# Patient Record
Sex: Male | Born: 1950
Health system: Southern US, Community
[De-identification: ages and names within clinical notes are randomized; demographics above are authoritative.]

## PROBLEM LIST (undated history)

## (undated) DIAGNOSIS — H269 Unspecified cataract: Secondary | ICD-10-CM

## (undated) DIAGNOSIS — Z8781 Personal history of (healed) traumatic fracture: Secondary | ICD-10-CM

## (undated) DIAGNOSIS — R001 Bradycardia, unspecified: Secondary | ICD-10-CM

## (undated) DIAGNOSIS — T7840XA Allergy, unspecified, initial encounter: Secondary | ICD-10-CM

## (undated) DIAGNOSIS — M25519 Pain in unspecified shoulder: Secondary | ICD-10-CM

## (undated) DIAGNOSIS — N529 Male erectile dysfunction, unspecified: Secondary | ICD-10-CM

## (undated) DIAGNOSIS — R011 Cardiac murmur, unspecified: Secondary | ICD-10-CM

## (undated) DIAGNOSIS — M7550 Bursitis of unspecified shoulder: Secondary | ICD-10-CM

## (undated) DIAGNOSIS — Z86018 Personal history of other benign neoplasm: Secondary | ICD-10-CM

## (undated) DIAGNOSIS — E119 Type 2 diabetes mellitus without complications: Secondary | ICD-10-CM

## (undated) HISTORY — PX: COLONOSCOPY: SHX174

## (undated) HISTORY — DX: Cardiac murmur, unspecified: R01.1

## (undated) HISTORY — DX: Pain in unspecified shoulder: M25.519

## (undated) HISTORY — DX: Bursitis of unspecified shoulder: M75.50

## (undated) HISTORY — DX: Type 2 diabetes mellitus without complications: E11.9

## (undated) HISTORY — DX: Bradycardia, unspecified: R00.1

## (undated) HISTORY — PX: EYE SURGERY: SHX253

## (undated) HISTORY — DX: Allergy, unspecified, initial encounter: T78.40XA

## (undated) HISTORY — DX: Male erectile dysfunction, unspecified: N52.9

## (undated) HISTORY — PX: OTHER SURGICAL HISTORY: SHX169

## (undated) HISTORY — DX: Unspecified cataract: H26.9

## (undated) HISTORY — DX: Personal history of (healed) traumatic fracture: Z87.81

---
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
## 1994-07-01 HISTORY — PX: RECONSTRUCTION OF NOSE: SHX2301

## 2006-09-24 ENCOUNTER — Ambulatory Visit: Payer: Self-pay | Admitting: Gastroenterology

## 2009-10-09 ENCOUNTER — Ambulatory Visit: Payer: Self-pay | Admitting: Family Medicine

## 2009-11-06 ENCOUNTER — Ambulatory Visit: Payer: Self-pay | Admitting: Family Medicine

## 2010-07-26 IMAGING — CR DG CHEST 2V
1 series · 2 of 2 positions shown · non-contrast
Comparison: none

REASON FOR EXAM: cough/fever
COMMENTS:

PROCEDURE:     KDR - KDXR CHEST PA (OR AP) AND LAT  - October 09, 2009 [DATE]
RESULT:
Areas of increased density project in the region of the right middle lobe
and superior segment left lower lobe. The cardiac silhouette and visualized
bony skeleton are unremarkable.

[Series 1: view not recorded · 0.17mm/px · 2 of 2 slices shown]
[im 1/2]
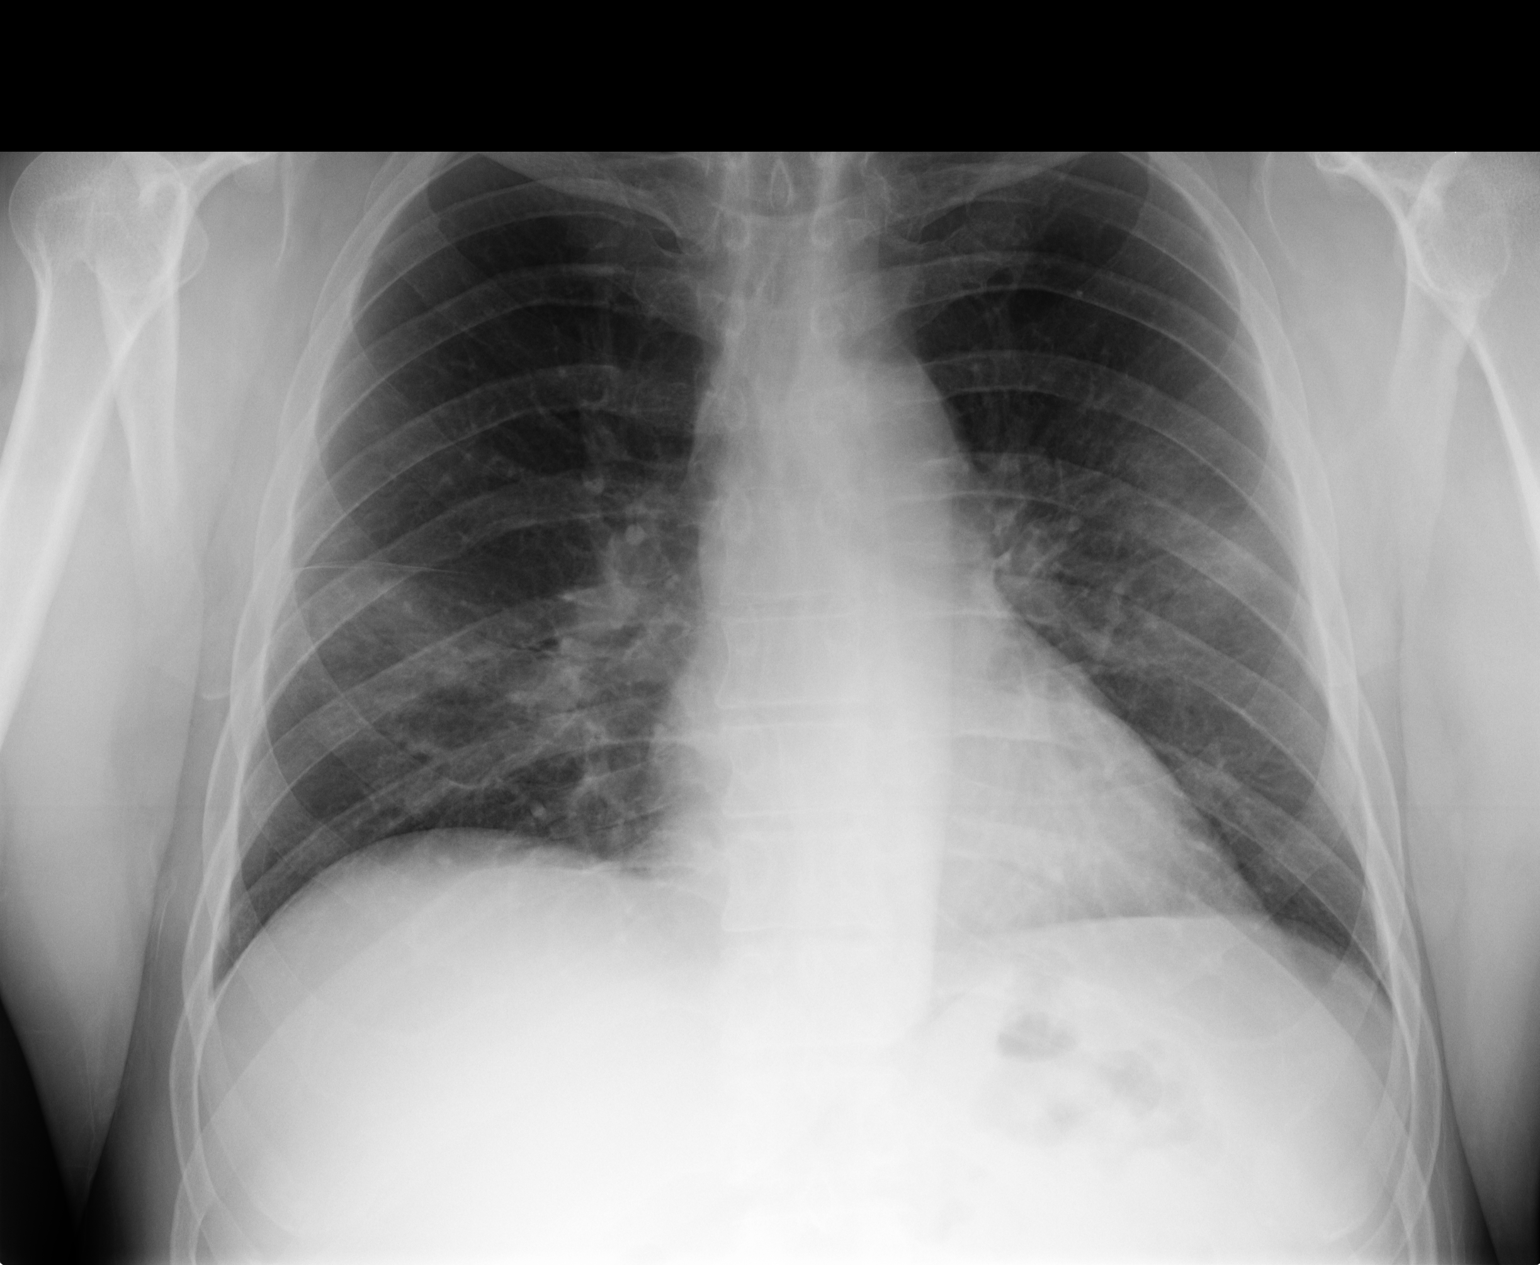
[im 2/2]
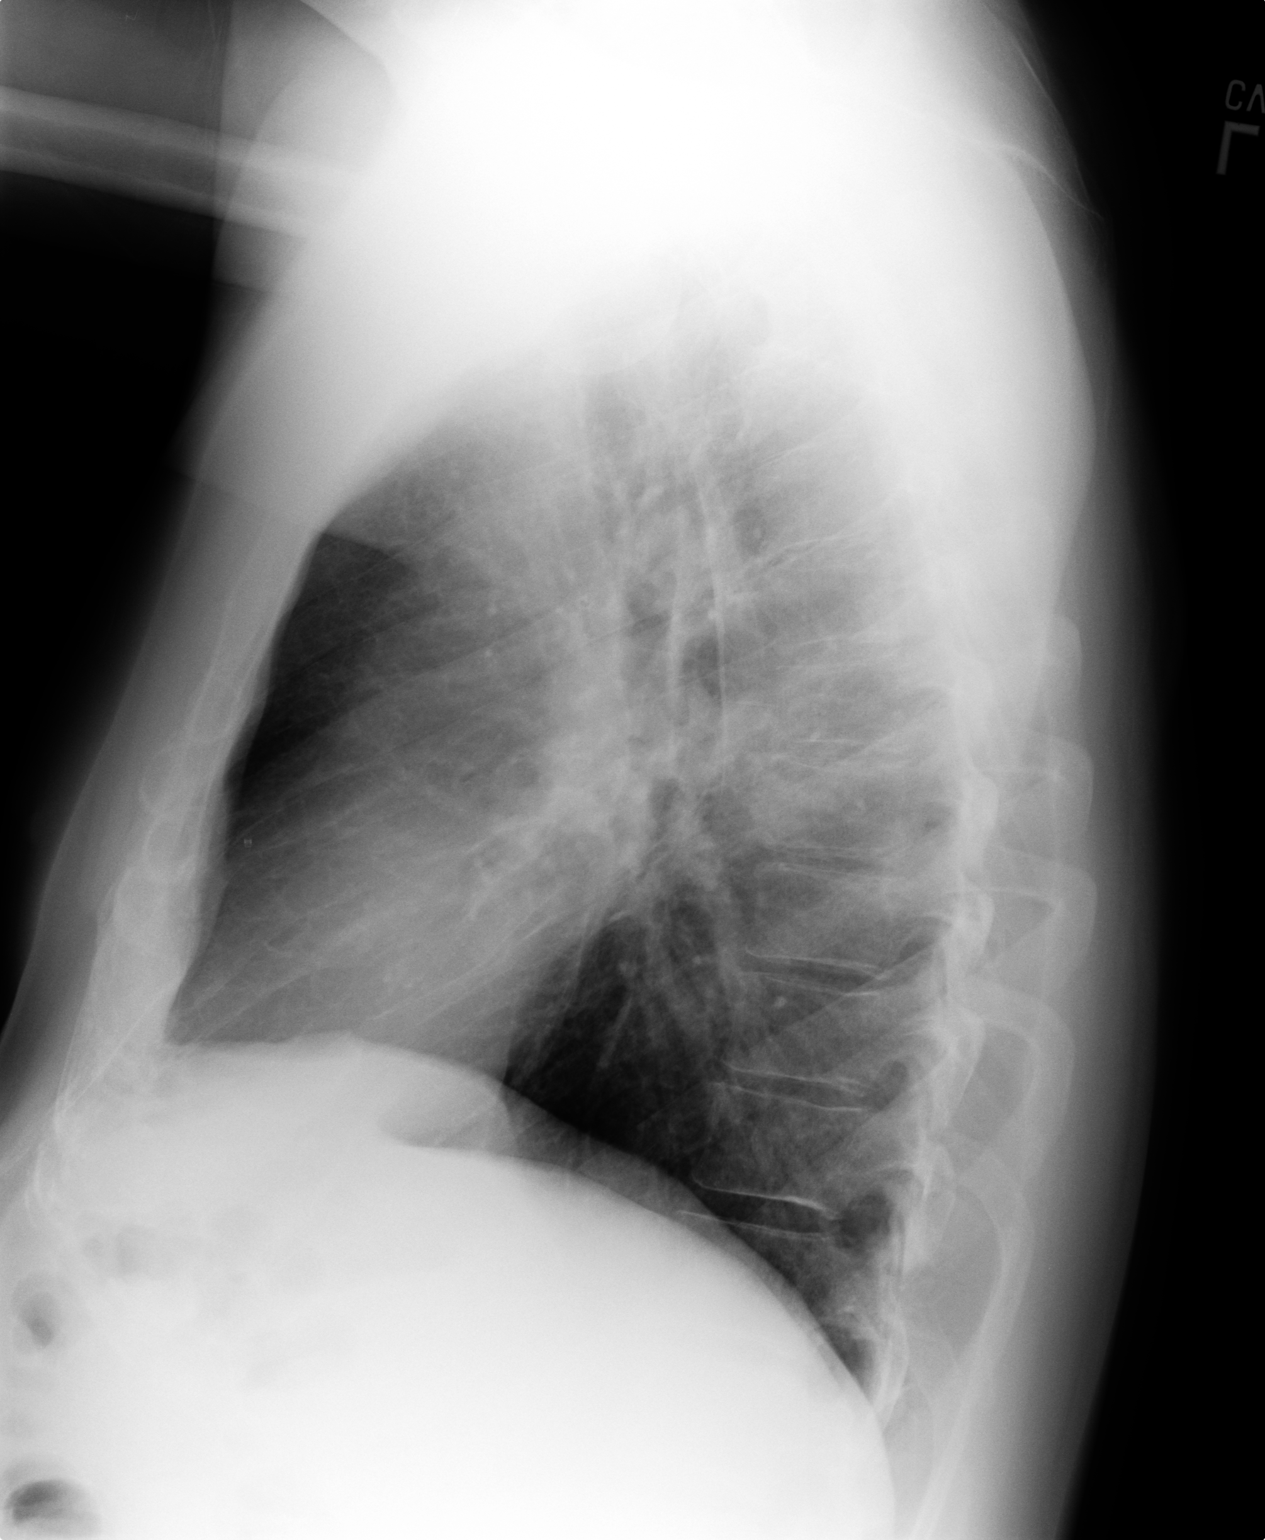

[2 of 2 positions shown; findings below may reference images not displayed]

IMPRESSION: 1. Multilobar infiltrates as described above. Surveillance evaluation
recommended status post appropriate therapeutic regimen.

## 2010-08-23 IMAGING — CR DG CHEST 2V
1 series · 2 of 2 positions shown · non-contrast
Comparison: none

REASON FOR EXAM: cough
COMMENTS:

[Series 1: view not recorded · 0.17mm/px · 2 of 2 slices shown]
[im 1/2]
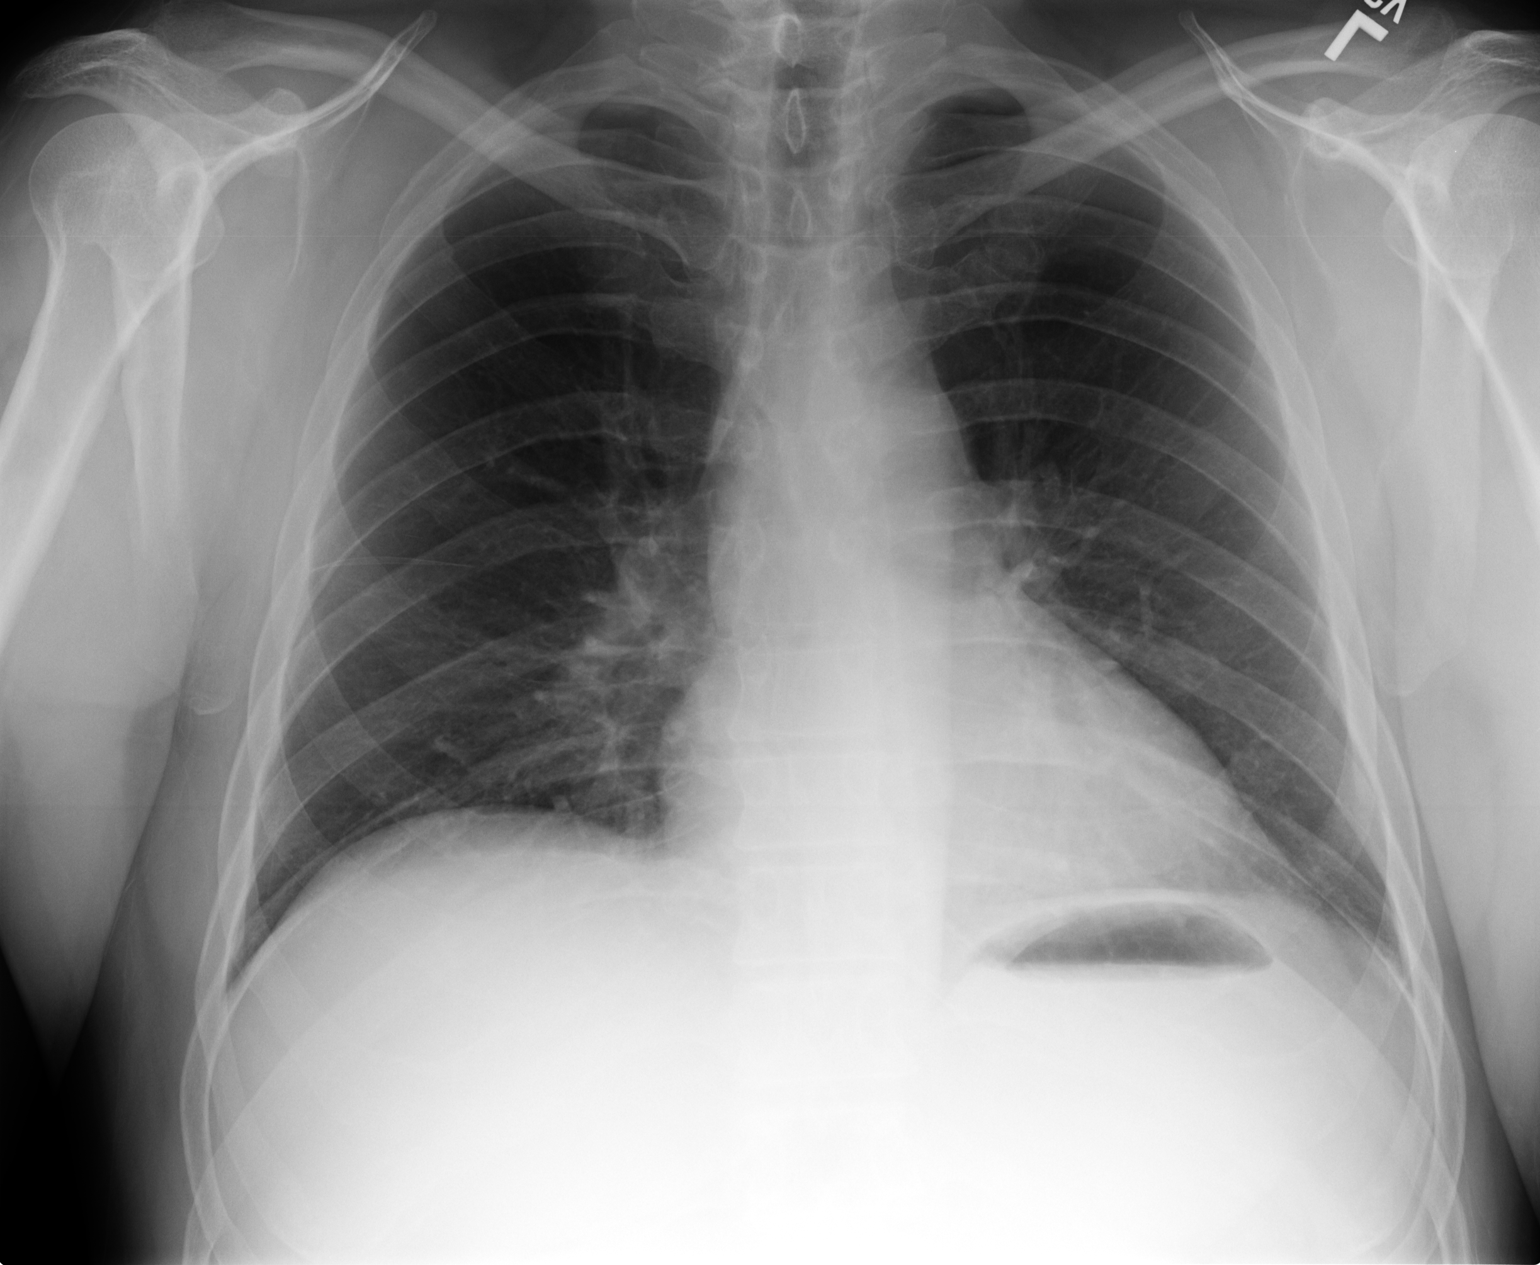
[im 2/2]
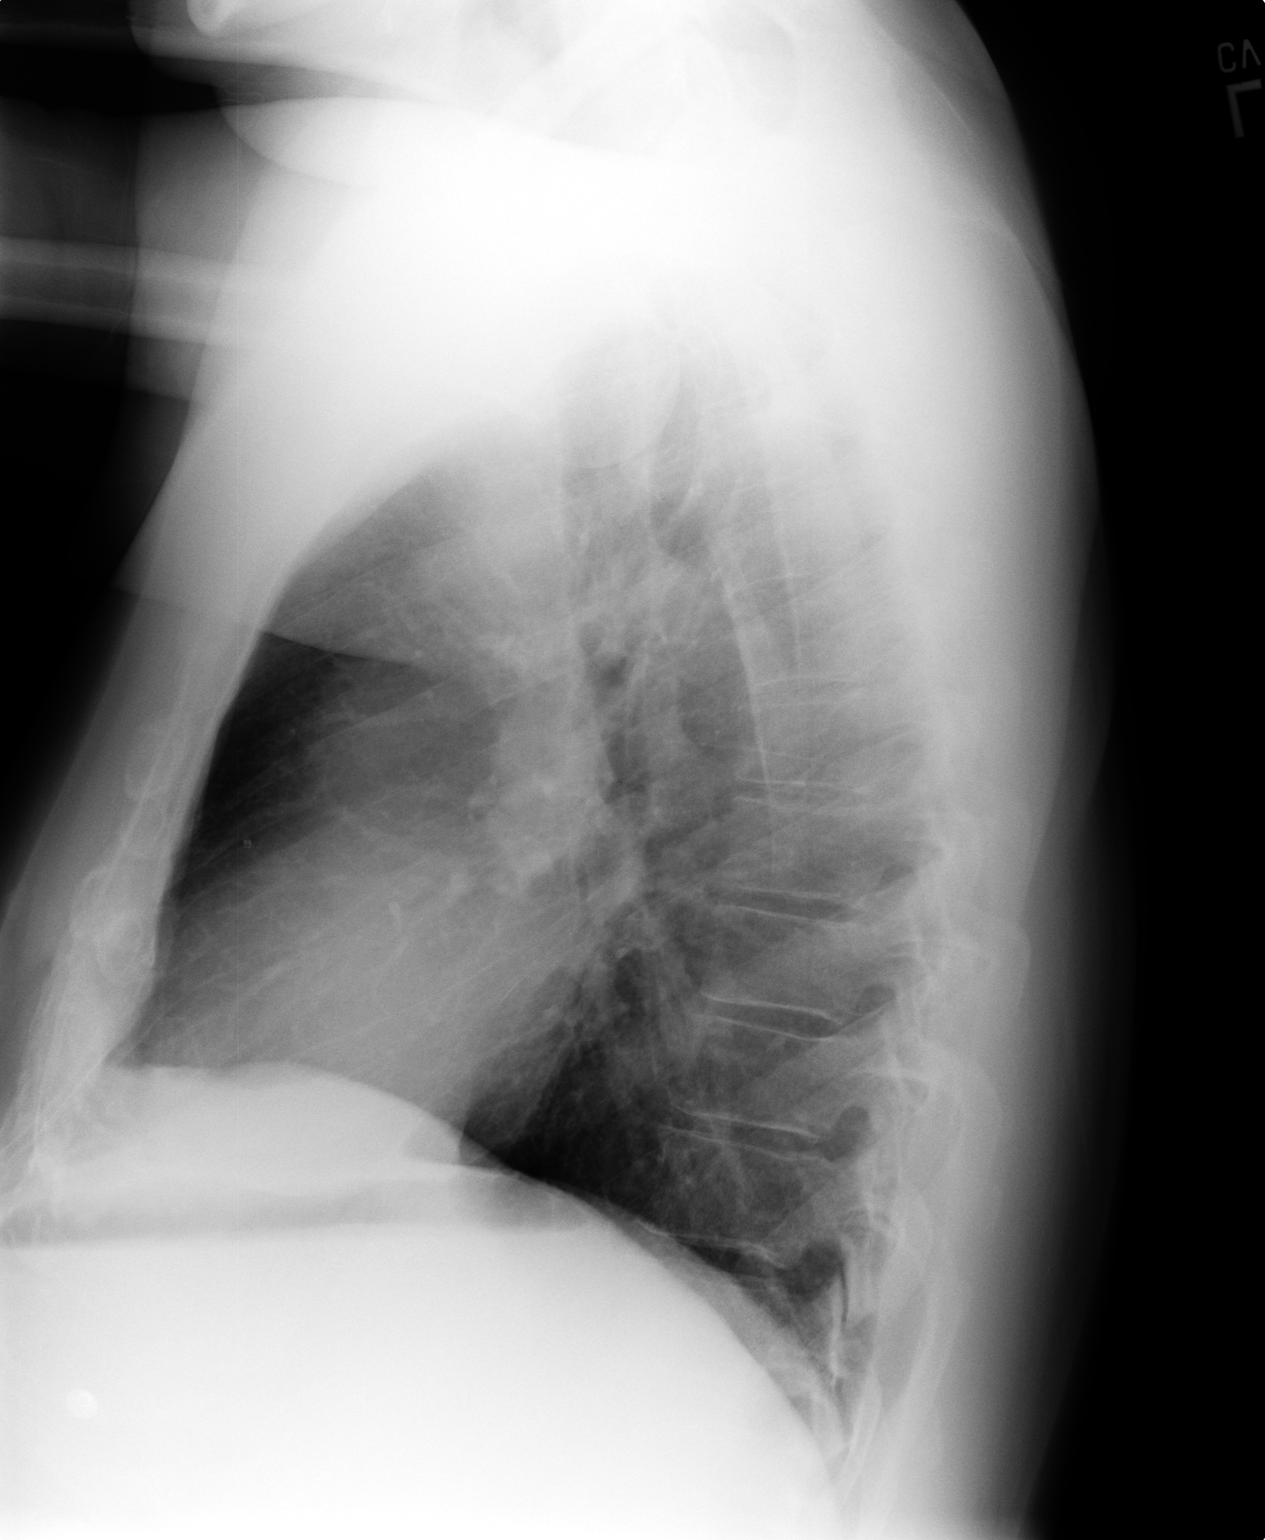

[2 of 2 positions shown; findings below may reference images not displayed]

PROCEDURE:     KDR - KDXR CHEST PA (OR AP) AND LAT  - November 06, 2009  [DATE]

RESULT:     Comparison is made to the prior exam of 10/09/2009. The
previously noted bilateral pulmonary infiltrates are no longer seen. The
current exam shows the lung fields to be clear. Heart size is normal. The
mediastinal and osseous structures are normal in appearance.
IMPRESSION: 1. No significant abnormalities are noted.
2. The previously observed bilateral pulmonary infiltrates have now cleared.

## 2012-06-12 LAB — HM HEPATITIS C SCREENING LAB: HM HEPATITIS C SCREENING: NEGATIVE

## 2012-07-13 DIAGNOSIS — Z86018 Personal history of other benign neoplasm: Secondary | ICD-10-CM

## 2012-07-13 HISTORY — DX: Personal history of other benign neoplasm: Z86.018

## 2013-04-09 ENCOUNTER — Encounter: Payer: Self-pay | Admitting: *Deleted

## 2013-04-09 ENCOUNTER — Ambulatory Visit (INDEPENDENT_AMBULATORY_CARE_PROVIDER_SITE_OTHER): Payer: BC Managed Care – PPO | Admitting: Cardiovascular Disease

## 2013-04-09 ENCOUNTER — Encounter: Payer: Self-pay | Admitting: Cardiovascular Disease

## 2013-04-09 VITALS — BP 143/82 | HR 68 | Ht 71.0 in | Wt 208.5 lb

## 2013-04-09 DIAGNOSIS — I491 Atrial premature depolarization: Secondary | ICD-10-CM

## 2013-04-09 DIAGNOSIS — R001 Bradycardia, unspecified: Secondary | ICD-10-CM

## 2013-04-09 DIAGNOSIS — I498 Other specified cardiac arrhythmias: Secondary | ICD-10-CM

## 2013-04-09 MED ORDER — ASPIRIN EC 81 MG PO TBEC: 81.0000 mg | DELAYED_RELEASE_TABLET | Freq: Every day | ORAL | Status: AC

## 2013-04-09 NOTE — Progress Notes (Signed)
HPI  This is a pleasant 62 year old man who was referred by Dr. Elease Hashimoto for evaluation of mild hypotension and bradycardia. He has no previous cardiac history. He has known history of type 2 diabetes which has been reasonably controlled. He has no history of hypertension or hyperlipidemia. There is no family history of coronary artery disease. He smoked many years ago he was a teenager. He denies any chest pain, dyspnea, dizziness, syncope or presyncope. Recently, he was noted to have a heart rate of 58 beats per minute. His labs were unremarkable including normal thyroid function. He denies any dizziness even when his heart rate is below 60. His blood pressure was also slightly low at 106/58. He did have a previous Holter monitor done which showed sinus rhythm with a minimum heart rate of 44 beats per minute an average heart rate of 66 beats per minute.  No Known Allergies   No current outpatient prescriptions on file prior to visit.   No current facility-administered medications on file prior to visit.     Past Medical History  Diagnosis Date  . Bursitis of shoulder   . Shoulder pain   . Diabetes mellitus without complication   . Bradycardia   . Erectile dysfunction   . History of broken nose   . Heart murmur     as child     Past Surgical History  Procedure Laterality Date  . Collar bone fracture    . Colonoscopy       Family History  Problem Relation Age of Onset  . Diabetes Mother   . Dementia Mother   . Hypertension Mother   . Bone cancer Father   . Diabetes Father   . Heart attack Paternal Grandfather      History   Social History  . Marital Status: Married    Spouse Name: N/A    Number of Children: N/A  . Years of Education: N/A   Occupational History  . Not on file.   Social History Main Topics  . Smoking status: Former Smoker -- 0.25 packs/day for 5 years    Types: Cigarettes  . Smokeless tobacco: Not on file  . Alcohol Use: Yes     Comment:  occassional  . Drug Use: No  . Sexual Activity: Not on file   Other Topics Concern  . Not on file   Social History Narrative  . No narrative on file     ROS A 10 point review of system was performed. It is negative other than what is mentioned in the history of present illness.  PHYSICAL EXAM   BP 143/82  Pulse 68  Ht 5\' 11"  (1.803 m)  Wt 208 lb 8 oz (94.575 kg)  BMI 29.09 kg/m2 Constitutional: He is oriented to person, place, and time. He appears well-developed and well-nourished. No distress.  HENT: No nasal discharge.  Head: Normocephalic and atraumatic.  Eyes: Pupils are equal and round. Right eye exhibits no discharge. Left eye exhibits no discharge.  Neck: Normal range of motion. Neck supple. No JVD present. No thyromegaly present.  Cardiovascular: Normal rate, regular rhythm with premature beats, normal heart sounds and. Exam reveals no gallop and no friction rub. No murmur heard.  Pulmonary/Chest: Effort normal and breath sounds normal. No stridor. No respiratory distress. He has no wheezes. He has no rales. He exhibits no tenderness.  Abdominal: Soft. Bowel sounds are normal. He exhibits no distension. There is no tenderness. There is no rebound and no guarding.  Musculoskeletal: Normal range of motion. He exhibits no edema and no tenderness.  Neurological: He is alert and oriented to person, place, and time. Coordination normal.  Skin: Skin is warm and dry. No rash noted. He is not diaphoretic. No erythema. No pallor.  Psychiatric: He has a normal mood and affect. His behavior is normal. Judgment and thought content normal.       EKG: Normal sinus rhythm with PACs and nonspecific ST changes.   ASSESSMENT AND PLAN

## 2013-04-09 NOTE — Assessment & Plan Note (Signed)
Mild sinus bradycardia but he is completely asymptomatic with no dizziness, syncope or presyncope. No evidence of AV block. Thyroid function was normal. No indication for intervention at this point. Should avoid AV nodal blocking agents.

## 2013-04-09 NOTE — Assessment & Plan Note (Signed)
He does have PACs noted on EKG but that he has no symptoms of palpitations. He has no symptoms suggestive of angina. Thus, no stress testing is indicated. Given his known history of diabetes, I do recommend aspirin 81 mg once daily. I discussed with him the importance of healthy diet and regular exercise. Recent lipid profile showed an LDL of less than 100.

## 2013-04-09 NOTE — Patient Instructions (Signed)
Start Aspirin 81 mg once daily.   Follow up as needed.

## 2013-04-21 ENCOUNTER — Encounter: Payer: Self-pay | Admitting: *Deleted

## 2014-10-25 LAB — HM DIABETES EYE EXAM

## 2014-12-27 DIAGNOSIS — E11319 Type 2 diabetes mellitus with unspecified diabetic retinopathy without macular edema: Secondary | ICD-10-CM | POA: Insufficient documentation

## 2014-12-27 DIAGNOSIS — R001 Bradycardia, unspecified: Secondary | ICD-10-CM | POA: Insufficient documentation

## 2014-12-27 DIAGNOSIS — N529 Male erectile dysfunction, unspecified: Secondary | ICD-10-CM | POA: Insufficient documentation

## 2014-12-27 DIAGNOSIS — M7550 Bursitis of unspecified shoulder: Secondary | ICD-10-CM | POA: Insufficient documentation

## 2015-01-13 ENCOUNTER — Ambulatory Visit (INDEPENDENT_AMBULATORY_CARE_PROVIDER_SITE_OTHER): Payer: BC Managed Care – PPO | Admitting: Family Medicine

## 2015-01-13 ENCOUNTER — Encounter: Payer: Self-pay | Admitting: Family Medicine

## 2015-01-13 VITALS — BP 106/60 | HR 56 | Temp 98.3°F | Resp 16 | Wt 208.0 lb

## 2015-01-13 DIAGNOSIS — E119 Type 2 diabetes mellitus without complications: Secondary | ICD-10-CM | POA: Diagnosis not present

## 2015-01-13 DIAGNOSIS — E785 Hyperlipidemia, unspecified: Secondary | ICD-10-CM

## 2015-01-13 LAB — POCT GLYCOSYLATED HEMOGLOBIN (HGB A1C): Hemoglobin A1C: 6.5

## 2015-01-13 NOTE — Progress Notes (Signed)
Subjective:    Patient ID: Mark Fitzpatrick, male    DOB: September 07, 1950, 64 y.o.   MRN: [id number]  Diabetes He presents for his follow-up diabetic visit. He has type 2 diabetes mellitus. His disease course has been stable (Last A1C was 09/19/2014 at 6.9%). Hypoglycemia symptoms include sweats. There are no diabetic associated symptoms. Symptoms are stable. Risk factors for coronary artery disease include dyslipidemia. Current diabetic treatment includes oral agent (triple therapy). He is compliant with treatment all of the time. His weight is stable.  Hyperlipidemia This is a chronic problem. The problem is controlled. Recent lipid tests were reviewed and are normal (Last Cholesterol 04/27/2014,  Total Cholesterol:  181;  Tri:  176;  HDL:  58;  LDL:  88). Exacerbating diseases include diabetes. There are no compliance problems.  Risk factors for coronary artery disease include dyslipidemia and diabetes mellitus.   Does not monitor why has lows. Has really not had trouble with it recently.  Will monitor.  Patient Active Problem List   Diagnosis Date Noted  . Bradycardia, sinus 12/27/2014  . Bursitis of shoulder 12/27/2014  . Diabetes mellitus, type 2 12/27/2014  . Failure of erection 12/27/2014  . HLD (hyperlipidemia) 12/27/2014  . Bradycardia 04/09/2013  . Premature atrial contractions 04/09/2013   Family History  Problem Relation Age of Onset  . Diabetes Mother   . Dementia Mother   . Hypertension Mother   . Bone cancer Father     spread to liver  . Diabetes Father   . Heart attack Paternal Grandfather   . Healthy Brother   . Ovarian cancer Maternal Grandmother   . Cancer Maternal Grandfather     unknown  . Healthy Brother    History   Social History  . Marital Status: Married    Spouse Name: N/A  . Number of Children: N/A  . Years of Education: H/S   Occupational History  . Retired    Social History Main Topics  . Smoking status: Former Smoker -- 0.25 packs/day for 5  years    Types: Cigarettes    Quit date: 06/30/1984  . Smokeless tobacco: Never Used  . Alcohol Use: Yes     Comment: occassional  . Drug Use: No  . Sexual Activity: Not on file   Other Topics Concern  . Not on file   Social History Narrative   Past Surgical History  Procedure Laterality Date  . Collar bone fracture    . Colonoscopy    . Reconstruction of nose  1996   No Known Allergies Previous Medications   ASPIRIN EC 81 MG TABLET    Take 1 tablet (81 mg total) by mouth daily.   CINNAMON PO    Take 1,000 mg by mouth 2 (two) times daily.   GLIMEPIRIDE (AMARYL) 4 MG TABLET    Take by mouth.   METFORMIN (GLUCOPHAGE) 1000 MG TABLET    Take by mouth.   PIOGLITAZONE (ACTOS) 45 MG TABLET    Take by mouth.   SILDENAFIL (VIAGRA) 50 MG TABLET    Take 50 mg by mouth daily as needed for erectile dysfunction.   SITAGLIPTIN (JANUVIA) 100 MG TABLET    Take by mouth.   BP 106/60 mmHg  Pulse 56  Temp(Src) 98.3 F (36.8 C) (Oral)  Resp 16  Wt 208 lb (94.348 kg)   Review of Systems  Constitutional: Negative.   Eyes: Negative.   Respiratory: Negative.   Cardiovascular: Negative.   Gastrointestinal: Negative.  Endocrine: Negative.   Neurological: Negative.       Objective:   Physical Exam  Constitutional: He is oriented to person, place, and time. He appears well-developed and well-nourished.  Cardiovascular: Normal rate and regular rhythm.   Pulmonary/Chest: Effort normal and breath sounds normal.  Neurological: He is alert and oriented to person, place, and time.  Psychiatric: He has a normal mood and affect. His behavior is normal. Judgment and thought content normal.  BP 106/60 mmHg  Pulse 56  Temp(Src) 98.3 F (36.8 C) (Oral)  Resp 16  Wt 208 lb (94.348 kg)     Assessment & Plan:  1. Type 2 diabetes mellitus without complication Stable.  Continue current medication and plan of care. Recheck in 3 months.  - POCT HgB A1C Results for orders placed or performed in  visit on 01/13/15  POCT HgB A1C  Result Value Ref Range   Hemoglobin A1C 6.5     2. HLD (hyperlipidemia) Will check cholesterol and inflammatory markers in the fall. 10 year risk of heart disease is 13 percent. Will readdress statin in the fall.   Margarita Rana, MD

## 2015-02-28 ENCOUNTER — Other Ambulatory Visit: Payer: Self-pay | Admitting: Family Medicine

## 2015-02-28 DIAGNOSIS — E119 Type 2 diabetes mellitus without complications: Secondary | ICD-10-CM

## 2015-04-21 ENCOUNTER — Ambulatory Visit (INDEPENDENT_AMBULATORY_CARE_PROVIDER_SITE_OTHER): Payer: Medicare Other | Admitting: Family Medicine

## 2015-04-21 ENCOUNTER — Encounter: Payer: Self-pay | Admitting: Family Medicine

## 2015-04-21 VITALS — BP 108/68 | HR 58 | Temp 98.2°F | Resp 16 | Ht 71.0 in | Wt 206.0 lb

## 2015-04-21 DIAGNOSIS — Z23 Encounter for immunization: Secondary | ICD-10-CM | POA: Diagnosis not present

## 2015-04-21 DIAGNOSIS — E119 Type 2 diabetes mellitus without complications: Secondary | ICD-10-CM | POA: Diagnosis not present

## 2015-04-21 DIAGNOSIS — E785 Hyperlipidemia, unspecified: Secondary | ICD-10-CM

## 2015-04-21 LAB — POCT GLYCOSYLATED HEMOGLOBIN (HGB A1C): Hemoglobin A1C: 6.4

## 2015-04-21 NOTE — Progress Notes (Signed)
Patient ID: Mark Fitzpatrick, male   DOB: Dec 16, 1950, 64 y.o.   MRN: [id number]        Patient: Mark Fitzpatrick Male    DOB: Nov 11, 1950   64 y.o.   MRN: [id number] Visit Date: 04/21/2015  Today's Provider: Margarita Rana, MD   Chief Complaint  Patient presents with  . Diabetes   Subjective:    HPI  Diabetes Mellitus Type II, Follow-up:   Lab Results  Component Value Date   HGBA1C 6.4 04/21/2015   HGBA1C 6.5 01/13/2015    Last seen for diabetes 3 months ago.  Management since then includes none. He reports excellent compliance with treatment. He is not having side effects.  Current symptoms include none and have been improving. Home blood sugar records: stable   Episodes of hypoglycemia? A few, did eat something and it got better.   Current Insulin Regimen: NONE Most Recent Eye Exam: Several months ago Weight trend: stable.  Current diet: Stable Current exercise: Walking and golfing.   Pertinent Labs: No results found for: CHOL, TRIG, CHOLHDL, CREATININE  Wt Readings from Last 3 Encounters:  04/21/15 206 lb (93.441 kg)  01/13/15 208 lb (94.348 kg)  09/19/14 208 lb (94.348 kg)    ------------------------------------------------------------------------    Lipid/Cholesterol, Follow-up:   Last seen for this 52months ago. Management changes since that visit include none. . Last Lipid Panel: No results found for: CHOL, TRIG, HDL, CHOLHDL, VLDL, LDLCALC, LDLDIRECT  Risk factors for vascular disease include diabetes.   Planning to recheck cholesterol today.  Has not been on any medication.   Wt Readings from Last 3 Encounters:  04/21/15 206 lb (93.441 kg)  01/13/15 208 lb (94.348 kg)  09/19/14 208 lb (94.348 kg)    -------------------------------------------------------------------      No Known Allergies Previous Medications   ASPIRIN EC 81 MG TABLET    Take 1 tablet (81 mg total) by mouth daily.   CINNAMON PO    Take 1,000 mg by mouth 2 (two)  times daily.   GLIMEPIRIDE (AMARYL) 4 MG TABLET    Take by mouth.   METFORMIN (GLUCOPHAGE) 1000 MG TABLET    TAKE 1 TABLET BY MOUTH TWICE A DAY   PIOGLITAZONE (ACTOS) 45 MG TABLET    Take by mouth.   SILDENAFIL (VIAGRA) 50 MG TABLET    Take 50 mg by mouth daily as needed for erectile dysfunction.   SITAGLIPTIN (JANUVIA) 100 MG TABLET    Take by mouth.    Review of Systems  Constitutional: Negative.   Eyes: Negative.   Respiratory: Negative.   Neurological: Negative.   Psychiatric/Behavioral: Negative.     Social History  Substance Use Topics  . Smoking status: Former Smoker -- 0.25 packs/day for 5 years    Types: Cigarettes    Quit date: 06/30/1984  . Smokeless tobacco: Never Used  . Alcohol Use: Yes     Comment: occassional   Objective:   BP 108/68 mmHg  Pulse 58  Temp(Src) 98.2 F (36.8 C) (Oral)  Resp 16  Ht 5\' 11"  (1.803 m)  Wt 206 lb (93.441 kg)  BMI 28.74 kg/m2  SpO2 98%  Physical Exam  Constitutional: He is oriented to person, place, and time. He appears well-developed and well-nourished.  Cardiovascular: Normal rate and regular rhythm.   Pulmonary/Chest: Effort normal and breath sounds normal.  Neurological: He is alert and oriented to person, place, and time.  Psychiatric: He has a normal mood and affect. His behavior is normal. Judgment  and thought content normal.      Assessment & Plan:     1. Type 2 diabetes mellitus without complication, unspecified long term insulin use status (HCC) Improved. Continue current medication and plan of care and recheck in 3 months.  - POCT glycosylated hemoglobin (Hb A1C) Results for orders placed or performed in visit on 04/21/15  POCT glycosylated hemoglobin (Hb A1C)  Result Value Ref Range   Hemoglobin A1C 6.4     2. Need for pneumococcal vaccination Given today. - Pneumococcal conjugate vaccine 13-valent IM  3. Need for influenza vaccination Given today.  - Flu vaccine HIGH DOSE PF  4. HLD  (hyperlipidemia) Unclear if needs medication. Reluctant to start it. Will check labs and reassess.   - Comprehensive metabolic panel - Lipid panel - CRP High sensitivity       Margarita Rana, MD  [facility]

## 2015-04-22 LAB — COMPREHENSIVE METABOLIC PANEL
A/G RATIO: 2.2 (ref 1.1–2.5)
ALK PHOS: 46 IU/L (ref 39–117)
ALT: 17 IU/L (ref 0–44)
AST: 15 IU/L (ref 0–40)
Albumin: 4.8 g/dL (ref 3.6–4.8)
BUN/Creatinine Ratio: 11 (ref 10–22)
BUN: 13 mg/dL (ref 8–27)
Bilirubin Total: 0.5 mg/dL (ref 0.0–1.2)
CO2: 25 mmol/L (ref 18–29)
Calcium: 10.1 mg/dL (ref 8.6–10.2)
Chloride: 102 mmol/L (ref 97–106)
Creatinine, Ser: 1.17 mg/dL (ref 0.76–1.27)
GFR calc Af Amer: 75 mL/min/{1.73_m2} (ref >59)
GFR calc non Af Amer: 65 mL/min/{1.73_m2} (ref >59)
Globulin, Total: 2.2 g/dL (ref 1.5–4.5)
Glucose: 172 mg/dL — ABNORMAL HIGH (ref 65–99)
POTASSIUM: 6.5 mmol/L — AB (ref 3.5–5.2)
SODIUM: 144 mmol/L (ref 136–144)
Total Protein: 7 g/dL (ref 6.0–8.5)

## 2015-04-22 LAB — LIPID PANEL
CHOL/HDL RATIO: 2.9 ratio (ref 0.0–5.0)
Cholesterol, Total: 185 mg/dL (ref 100–199)
HDL: 64 mg/dL (ref >39)
LDL CALC: 99 mg/dL (ref 0–99)
TRIGLYCERIDES: 109 mg/dL (ref 0–149)
VLDL CHOLESTEROL CAL: 22 mg/dL (ref 5–40)

## 2015-04-22 LAB — HIGH SENSITIVITY CRP: CRP HIGH SENSITIVITY: 0.37 mg/L (ref 0.00–3.00)

## 2015-04-24 ENCOUNTER — Telehealth: Payer: Self-pay

## 2015-04-24 NOTE — Telephone Encounter (Signed)
Advised pt of lab results. Pt verbally acknowledges understanding. Pt does not wish to start Statin at this time; agrees to recheck at 3 month FU. Renaldo Fiddler, CMA

## 2015-04-24 NOTE — Telephone Encounter (Signed)
-----   Message from Margarita Rana, MD sent at 04/22/2015  3:00 PM EDT ----- Cholesterol ok at 185.  Hs-CP is low.   Still not clear cut if should take medication,  10 year risk  Of heart disease is 14 percent and most sources would still recommend a statin.  Can also have cardiac calcium score done, which is like a cardiac CT and that would tell if there was any plaque build up. The test is 150 dollars. Insurance does not pay for it.  Or can recheck in 3 to 6 months and continue to eat healthy and exercise.   Thanks.

## 2015-04-25 ENCOUNTER — Telehealth: Payer: Self-pay

## 2015-04-25 NOTE — Telephone Encounter (Signed)
Advised wife of lab results. Renaldo Fiddler, CMA

## 2015-04-25 NOTE — Telephone Encounter (Signed)
Patient called and would like for someone to call and speak to his wife, Dustin Folks and tell her what he was told yesterday about his results, he did not understand information completely-aa

## 2015-07-21 ENCOUNTER — Ambulatory Visit (INDEPENDENT_AMBULATORY_CARE_PROVIDER_SITE_OTHER): Payer: Medicare Other | Admitting: Family Medicine

## 2015-07-21 ENCOUNTER — Encounter: Payer: Self-pay | Admitting: Family Medicine

## 2015-07-21 VITALS — BP 106/66 | HR 72 | Temp 98.0°F | Resp 16 | Ht 71.0 in | Wt 207.0 lb

## 2015-07-21 DIAGNOSIS — E785 Hyperlipidemia, unspecified: Secondary | ICD-10-CM | POA: Diagnosis not present

## 2015-07-21 DIAGNOSIS — E119 Type 2 diabetes mellitus without complications: Secondary | ICD-10-CM | POA: Diagnosis not present

## 2015-07-21 LAB — POCT GLYCOSYLATED HEMOGLOBIN (HGB A1C)
Est. average glucose Bld gHb Est-mCnc: 143
Hemoglobin A1C: 6.6

## 2015-07-21 NOTE — Progress Notes (Signed)
Patient ID: BREVAN Fitzpatrick, male   DOB: 1951-01-13, 65 y.o.   MRN: [id number]       Patient: Mark Fitzpatrick Male    DOB: 09-Feb-1951   65 y.o.   MRN: [id number] Visit Date: 07/21/2015  Today's Provider: Margarita Rana, MD   Chief Complaint  Patient presents with  . Diabetes  . Hyperlipidemia   Subjective:    HPI  Diabetes Mellitus Type II, Follow-up:   Lab Results  Component Value Date   HGBA1C 6.6 07/21/2015   HGBA1C 6.4 04/21/2015   HGBA1C 6.5 01/13/2015    Last seen for diabetes 3 months ago.  Management since then includes no changes. He reports excellent compliance with treatment. He is not having side effects.  Current symptoms include none and have been stable. Home blood sugar records: trend: stable 130's  Episodes of hypoglycemia? yes - 30, once last week   Most Recent Eye Exam: end of last year [facility] Weight trend: stable Prior visit with dietician: no Current diet: in general, a "healthy" diet   Current exercise: walking  Pertinent Labs:    Component Value Date/Time   CHOL 185 04/21/2015 1054   TRIG 109 04/21/2015 1054   HDL 64 04/21/2015 1054   LDLCALC 99 04/21/2015 1054   CREATININE 1.17 04/21/2015 1054    Wt Readings from Last 3 Encounters:  07/21/15 207 lb (93.895 kg)  04/21/15 206 lb (93.441 kg)  01/13/15 208 lb (94.348 kg)   ------------------------------------------------------------------------    Lipid/Cholesterol, Follow-up:   Last seen for this3 months ago.  Management changes since that visit include labs checked. . Last Lipid Panel:    Component Value Date/Time   CHOL 185 04/21/2015 1054   TRIG 109 04/21/2015 1054   HDL 64 04/21/2015 1054   CHOLHDL 2.9 04/21/2015 1054   Goodman 99 04/21/2015 1054    Risk factors for vascular disease include diabetes mellitus Patient aware that his heart disease is at 14% and was recommended to start statin. Patient declined and reported that he would work on healthy  lifestyle.  Current exercise: walking, 30-45 minutes 2-3 times a week.  Wt Readings from Last 3 Encounters:  07/21/15 207 lb (93.895 kg)  04/21/15 206 lb (93.441 kg)  01/13/15 208 lb (94.348 kg)   -------------------------------------------------------------------     No Known Allergies Previous Medications   ASPIRIN EC 81 MG TABLET    Take 1 tablet (81 mg total) by mouth daily.   CINNAMON PO    Take 1,000 mg by mouth 2 (two) times daily.   GLIMEPIRIDE (AMARYL) 4 MG TABLET    Take by mouth.   METFORMIN (GLUCOPHAGE) 1000 MG TABLET    TAKE 1 TABLET BY MOUTH TWICE A DAY   PIOGLITAZONE (ACTOS) 45 MG TABLET    Take by mouth.   SILDENAFIL (VIAGRA) 50 MG TABLET    Take 50 mg by mouth daily as needed for erectile dysfunction.   SITAGLIPTIN (JANUVIA) 100 MG TABLET    Take by mouth.    Review of Systems  Constitutional: Negative.   Eyes: Negative.   Cardiovascular: Negative.   Endocrine: Negative.     Social History  Substance Use Topics  . Smoking status: Former Smoker -- 0.25 packs/day for 5 years    Types: Cigarettes    Quit date: 06/30/1984  . Smokeless tobacco: Never Used  . Alcohol Use: Yes     Comment: occassional   Objective:   BP 106/66 mmHg  Pulse 72  Temp(Src) 98 F (  36.7 C) (Oral)  Resp 16  Ht 5\' 11"  (1.803 m)  Wt 207 lb (93.895 kg)  BMI 28.88 kg/m2  SpO2 96%  Physical Exam  Constitutional: He appears well-developed and well-nourished.  Cardiovascular: Normal rate, regular rhythm and normal heart sounds.   Pulmonary/Chest: Effort normal and breath sounds normal.  Skin: Skin is warm and dry.  Psychiatric: He has a normal mood and affect. His behavior is normal. Judgment and thought content normal.      Assessment & Plan:     1. Diabetes mellitus without complication (HCC) Stable. Patient advised to continue current medications and plan of care. Follow-up in 4 months. - POCT glycosylated hemoglobin (Hb A1C) Results for orders placed or performed in  visit on 07/21/15  POCT glycosylated hemoglobin (Hb A1C)  Result Value Ref Range   Hemoglobin A1C 6.6    Est. average glucose Bld gHb Est-mCnc 143    Diabetic Foot Exam - Simple   Simple Foot Form  Diabetic Foot exam was performed with the following findings:  Yes 07/21/2015 12:05 PM  Visual Inspection  No deformities, no ulcerations, no other skin breakdown bilaterally:  Yes  Sensation Testing  Intact to touch and monofilament testing bilaterally:  Yes  Pulse Check  Posterior Tibialis and Dorsalis pulse intact bilaterally:  Yes  Comments      2. HLD (hyperlipidemia) Stable. Patient advised to continue working on healthy lifestyle.      Patient seen and examined by Dr. Jerrell Belfast, and note scribed by Philbert Riser. Dimas, CMA.  I have reviewed the document for accuracy and completeness and I agree with above. - Jerrell Belfast, MD   Margarita Rana, MD  [facility]

## 2015-08-29 ENCOUNTER — Other Ambulatory Visit: Payer: Self-pay | Admitting: Family Medicine

## 2015-08-29 DIAGNOSIS — E119 Type 2 diabetes mellitus without complications: Secondary | ICD-10-CM

## 2015-09-05 ENCOUNTER — Other Ambulatory Visit: Payer: Self-pay

## 2015-09-05 DIAGNOSIS — E119 Type 2 diabetes mellitus without complications: Secondary | ICD-10-CM

## 2015-09-05 MED ORDER — PIOGLITAZONE HCL 45 MG PO TABS: 45.0000 mg | ORAL_TABLET | Freq: Every day | ORAL | Status: DC

## 2015-09-27 ENCOUNTER — Other Ambulatory Visit: Payer: Self-pay | Admitting: Family Medicine

## 2015-09-27 DIAGNOSIS — E119 Type 2 diabetes mellitus without complications: Secondary | ICD-10-CM

## 2015-11-14 DIAGNOSIS — E113299 Type 2 diabetes mellitus with mild nonproliferative diabetic retinopathy without macular edema, unspecified eye: Secondary | ICD-10-CM

## 2015-11-17 ENCOUNTER — Ambulatory Visit (INDEPENDENT_AMBULATORY_CARE_PROVIDER_SITE_OTHER): Payer: Medicare Other | Admitting: Family Medicine

## 2015-11-17 ENCOUNTER — Encounter: Payer: Self-pay | Admitting: Family Medicine

## 2015-11-17 VITALS — BP 90/52 | HR 62 | Temp 98.2°F | Resp 16 | Ht 71.0 in | Wt 208.0 lb

## 2015-11-17 DIAGNOSIS — E785 Hyperlipidemia, unspecified: Secondary | ICD-10-CM | POA: Diagnosis not present

## 2015-11-17 DIAGNOSIS — E119 Type 2 diabetes mellitus without complications: Secondary | ICD-10-CM | POA: Diagnosis not present

## 2015-11-17 DIAGNOSIS — R001 Bradycardia, unspecified: Secondary | ICD-10-CM

## 2015-11-17 LAB — POCT GLYCOSYLATED HEMOGLOBIN (HGB A1C)
Est. average glucose Bld gHb Est-mCnc: 154
Hemoglobin A1C: 7

## 2015-11-17 NOTE — Progress Notes (Signed)
Patient ID: Mark Fitzpatrick, male   DOB: 07-20-1950, 65 y.o.   MRN: [id number]       Patient: Mark Fitzpatrick Male    DOB: [birth year]   65 y.o.   MRN: [id number] Visit Date: 11/17/2015  Today's Provider: Margarita Rana, MD   Chief Complaint  Patient presents with  . Diabetes  . Hyperlipidemia   Subjective:    HPI  Diabetes Mellitus Type II, Follow-up:   Lab Results  Component Value Date   HGBA1C 7.0 11/17/2015   HGBA1C 6.6 07/21/2015   HGBA1C 6.4 04/21/2015    Last seen for diabetes 4 months ago.  Management since then includes checking A1c. He reports excellent compliance with treatment. He is not having side effects.  Current symptoms include none and have been stable. Home blood sugar records: fasting range: not being checked  Episodes of hypoglycemia? no   Current Insulin Regimen: Most Recent Eye Exam: up to date Weight trend: stable Prior visit with dietician: no Current diet: in general, a "healthy" diet   Current exercise: yard work  Pertinent Labs:    Component Value Date/Time   CHOL 185 04/21/2015 1054   TRIG 109 04/21/2015 1054   HDL 64 04/21/2015 1054   LDLCALC 99 04/21/2015 1054   CREATININE 1.17 04/21/2015 1054    Wt Readings from Last 3 Encounters:  11/17/15 208 lb (94.348 kg)  07/21/15 207 lb (93.895 kg)  04/21/15 206 lb (93.441 kg)    ------------------------------------------------------------------------   Lipid/Cholesterol, Follow-up:   Last seen for this4 months ago.  Management changes since that visit include checking labs. . Last Lipid Panel:    Component Value Date/Time   CHOL 185 04/21/2015 1054   TRIG 109 04/21/2015 1054   HDL 64 04/21/2015 1054   CHOLHDL 2.9 04/21/2015 1054   [place] 99 04/21/2015 1054    Risk factors for vascular disease include diabetes mellitus  He reports excellent compliance with treatment. He is not having side effects.  Current symptoms include none and have been stable. Weight trend:  stable Prior visit with dietician: no Current diet: in general, a "healthy" diet   Current exercise: yard work  [facility] Readings from Last 3 Encounters:  11/17/15 208 lb (94.348 kg)  07/21/15 207 lb (93.895 kg)  04/21/15 206 lb (93.441 kg)    -------------------------------------------------------------------     No Known Allergies Previous Medications   ASPIRIN EC 81 MG TABLET    Take 1 tablet (81 mg total) by mouth daily.   CINNAMON PO    Take 1,000 mg by mouth daily.    GLIMEPIRIDE (AMARYL) 4 MG TABLET    TAKE HALF OF A TABLET BY MOUTH TWICE A DAY   JANUVIA 100 MG TABLET    TAKE 1 TABLET BY MOUTH EVERY DAY   METFORMIN (GLUCOPHAGE) 1000 MG TABLET    TAKE 1 TABLET BY MOUTH TWICE A DAY   PIOGLITAZONE (ACTOS) 45 MG TABLET    Take 1 tablet (45 mg total) by mouth daily.   SILDENAFIL (VIAGRA) 50 MG TABLET    Take 50 mg by mouth daily as needed for erectile dysfunction.    Review of Systems  Constitutional: Negative.   HENT: Negative.   Cardiovascular: Negative.   Endocrine: Negative.     Social History  Substance Use Topics  . Smoking status: Former Smoker -- 0.25 packs/day for 5 years    Types: Cigarettes    Quit date: 06/30/1984  . Smokeless tobacco: Never Used  . Alcohol Use: Yes  Comment: ocassional   Objective:   BP 90/52 mmHg  Pulse 62  Temp(Src) 98.2 F (36.8 C) (Oral)  Resp 16  Ht 5\' 11"  (1.803 m)  Wt 208 lb (94.348 kg)  BMI 29.02 kg/m2  SpO2 97%  Physical Exam  Constitutional: He is oriented to person, place, and time. He appears well-developed and well-nourished.  Cardiovascular: Normal rate and regular rhythm.   Pulmonary/Chest: Effort normal and breath sounds normal.  Neurological: He is alert and oriented to person, place, and time.  Psychiatric: He has a normal mood and affect. His behavior is normal. Judgment and thought content normal.        Assessment & Plan:      1. Diabetes mellitus without complication ([place]) Stable,but not quite at  goal. Continue current medication and increase exercise.   Recheck in 3 months with Dr. Caryn Section.  - POCT glycosylated hemoglobin (Hb A1C) Results for orders placed or performed in visit on 11/17/15  POCT glycosylated hemoglobin (Hb A1C)  Result Value Ref Range   Hemoglobin A1C 7.0    Est. average glucose Bld gHb Est-mCnc 154    2. HLD (hyperlipidemia) Not at goal. Explained current guidelines of cholesterol medication for all diabetics. Patient reluctant to start medication.  Will recheck labs today.   - Lipid panel - Comprehensive Metabolic Panel (CMET)  3. Bradycardia, sinus BP is low today. Has not eaten. No symptoms.  Increase fluid intake, monitor, follow up with any symptoms, including light-headedness, fatigue, SOB, etc.    Patient seen and examined by Dr. Jerrell Belfast, and note scribed by Philbert Riser. Dimas, CMA.  I have reviewed the document for accuracy and completeness and I agree with above. - Jerrell Belfast, MD   Margarita Rana, MD  [facility]

## 2015-11-18 LAB — COMPREHENSIVE METABOLIC PANEL
ALK PHOS: 44 IU/L (ref 39–117)
ALT: 15 IU/L (ref 0–44)
AST: 14 IU/L (ref 0–40)
Albumin/Globulin Ratio: 1.9 (ref 1.2–2.2)
Albumin: 4.6 g/dL (ref 3.6–4.8)
BILIRUBIN TOTAL: 0.7 mg/dL (ref 0.0–1.2)
BUN / CREAT RATIO: 12 (ref 10–24)
BUN: 12 mg/dL (ref 8–27)
CO2: 24 mmol/L (ref 18–29)
CREATININE: 1 mg/dL (ref 0.76–1.27)
Calcium: 9.5 mg/dL (ref 8.6–10.2)
Chloride: 102 mmol/L (ref 96–106)
GFR calc Af Amer: 91 mL/min/{1.73_m2} (ref >59)
GFR calc non Af Amer: 79 mL/min/{1.73_m2} (ref >59)
GLUCOSE: 165 mg/dL — AB (ref 65–99)
Globulin, Total: 2.4 g/dL (ref 1.5–4.5)
POTASSIUM: 5.4 mmol/L — AB (ref 3.5–5.2)
SODIUM: 142 mmol/L (ref 134–144)
Total Protein: 7 g/dL (ref 6.0–8.5)

## 2015-11-18 LAB — LIPID PANEL
CHOL/HDL RATIO: 2.8 ratio (ref 0.0–5.0)
Cholesterol, Total: 174 mg/dL (ref 100–199)
HDL: 63 mg/dL (ref >39)
LDL CALC: 91 mg/dL (ref 0–99)
Triglycerides: 100 mg/dL (ref 0–149)
VLDL CHOLESTEROL CAL: 20 mg/dL (ref 5–40)

## 2015-11-20 ENCOUNTER — Telehealth: Payer: Self-pay

## 2015-11-20 NOTE — Telephone Encounter (Signed)
-----   Message from Margarita Rana, MD sent at 11/19/2015 11:34 AM EDT ----- Labs stable.  Cholesterol is 174 with high good cholesterol. 10 year risk of heart disease is 10.5 % and medication is still recommended. Please see if patient would like to start medication. Also, potassium is better, but is still slightly above normal. Please make sure patient stays well hydrated and avoids high potassium foods like dark green veggies, avocados, bananas and sweet potatoes.   Thanks.

## 2015-11-20 NOTE — Telephone Encounter (Signed)
Patient advised as below. Patient states that he does not want to start cholesterol medication at this time.

## 2015-12-05 ENCOUNTER — Encounter: Payer: Self-pay | Admitting: Family Medicine

## 2016-02-24 ENCOUNTER — Other Ambulatory Visit: Payer: Self-pay | Admitting: Family Medicine

## 2016-02-24 DIAGNOSIS — E119 Type 2 diabetes mellitus without complications: Secondary | ICD-10-CM

## 2016-02-27 ENCOUNTER — Other Ambulatory Visit: Payer: Self-pay | Admitting: Family Medicine

## 2016-02-27 DIAGNOSIS — E119 Type 2 diabetes mellitus without complications: Secondary | ICD-10-CM

## 2016-03-20 ENCOUNTER — Encounter: Payer: Self-pay | Admitting: Family Medicine

## 2016-03-20 ENCOUNTER — Ambulatory Visit (INDEPENDENT_AMBULATORY_CARE_PROVIDER_SITE_OTHER): Payer: Medicare Other | Admitting: Family Medicine

## 2016-03-20 VITALS — BP 110/60 | HR 70 | Temp 98.2°F | Resp 16 | Ht 71.0 in | Wt 211.0 lb

## 2016-03-20 DIAGNOSIS — E119 Type 2 diabetes mellitus without complications: Secondary | ICD-10-CM | POA: Diagnosis not present

## 2016-03-20 DIAGNOSIS — Z23 Encounter for immunization: Secondary | ICD-10-CM

## 2016-03-20 LAB — POCT UA - MICROALBUMIN: Microalbumin Ur, POC: NEGATIVE mg/L

## 2016-03-20 LAB — POCT GLYCOSYLATED HEMOGLOBIN (HGB A1C)
Est. average glucose Bld gHb Est-mCnc: 146
HEMOGLOBIN A1C: 6.7

## 2016-03-20 MED ORDER — PIOGLITAZONE HCL 45 MG PO TABS: 45.0000 mg | ORAL_TABLET | Freq: Every day | ORAL | 3 refills | Status: DC

## 2016-03-20 NOTE — Progress Notes (Signed)
Patient: Mark Fitzpatrick Male    DOB: 11-Sep-1950   65 y.o.   MRN: [id number] Visit Date: 03/20/2016  Today's Provider: Lelon Huh, MD   Chief Complaint  Patient presents with  . Follow-up  . Diabetes  . Hyperlipidemia   Subjective:    HPI    Diabetes Mellitus Type II, Follow-up:   Lab Results  Component Value Date   HGBA1C 7.0 11/17/2015   HGBA1C 6.6 07/21/2015   HGBA1C 6.4 04/21/2015   Last seen for diabetes 4 months ago.  Management since then includes; no changes. He reports good compliance with treatment. He is not having side effects. none Current symptoms include none and have been unchanged. Home blood sugar records: fasting range: 80-137  Episodes of hypoglycemia? no   Current Insulin Regimen: n/a Most Recent Eye Exam: due Weight trend: stable Prior visit with dietician: no Current diet: in general, a "healthy" diet   Current exercise: some  ----------------------------------------------------------------    Lipid/Cholesterol, Follow-up:   Last seen for this 4 months ago.  Management since that visit includes; recommended starting medication but patient declined.  Last Lipid Panel:    Component Value Date/Time   CHOL 174 11/17/2015 1153   TRIG 100 11/17/2015 1153   HDL 63 11/17/2015 1153   CHOLHDL 2.8 11/17/2015 1153   LDLCALC 91 11/17/2015 1153    He reports good compliance with treatment. He is not having side effects. none  Wt Readings from Last 3 Encounters:  03/20/16 211 lb (95.7 kg)  11/17/15 208 lb (94.3 kg)  07/21/15 207 lb (93.9 kg)    ----------------------------------------------------------------    No Known Allergies   Current Outpatient Prescriptions:  .  aspirin EC 81 MG tablet, Take 1 tablet (81 mg total) by mouth daily., Disp: 90 tablet, Rfl: 3 .  CINNAMON PO, Take 1,000 mg by mouth daily. , Disp: , Rfl:  .  glimepiride (AMARYL) 4 MG tablet, TAKE HALF OF A TABLET BY MOUTH TWICE A DAY (Patient taking  differently: TAKE HALF OF A TABLET BY MOUTH daily), Disp: 90 tablet, Rfl: 1 .  JANUVIA 100 MG tablet, TAKE 1 TABLET BY MOUTH EVERY DAY, Disp: 90 tablet, Rfl: 3 .  metFORMIN (GLUCOPHAGE) 1000 MG tablet, TAKE 1 TABLET BY MOUTH TWICE A DAY, Disp: 180 tablet, Rfl: 3 .  pioglitazone (ACTOS) 45 MG tablet, Take 1 tablet (45 mg total) by mouth daily., Disp: 90 tablet, Rfl: 1 .  sildenafil (VIAGRA) 50 MG tablet, Take 50 mg by mouth daily as needed for erectile dysfunction., Disp: , Rfl:   Review of Systems  Constitutional: Negative for appetite change, chills and fever.  Respiratory: Negative for chest tightness, shortness of breath and wheezing.   Cardiovascular: Negative for chest pain and palpitations.  Gastrointestinal: Negative for abdominal pain, nausea and vomiting.    Social History  Substance Use Topics  . Smoking status: Former Smoker    Packs/day: 0.25    Years: 5.00    Types: Cigarettes    Quit date: 06/30/1984  . Smokeless tobacco: Never Used  . Alcohol use Yes     Comment: ocassional   Objective:   BP 110/60 (BP Location: Right Arm, Patient Position: Sitting, Cuff Size: Large)   Pulse 70   Temp 98.2 F (36.8 C) (Oral)   Resp 16   Ht 5\' 11"  (1.803 m)   Wt 211 lb (95.7 kg)   BMI 29.43 kg/m     Depression screen [facility] 2/9 03/20/2016  Decreased Interest 0  Down, Depressed, Hopeless 0  PHQ - 2 Score 0  Altered sleeping 0  Tired, decreased energy 0  Change in appetite 0  Feeling bad or failure about yourself  0  Trouble concentrating 0  Moving slowly or fidgety/restless 0  Suicidal thoughts 0  PHQ-9 Score 0  Difficult doing work/chores Not difficult at all   Fall Risk  03/20/2016  Falls in the past year? No   Functional Status Survey: Is the patient deaf or have difficulty hearing?: Yes (slight hearing loss) Does the patient have difficulty seeing, even when wearing glasses/contacts?: No Does the patient have difficulty concentrating, remembering, or making  decisions?: No Does the patient have difficulty walking or climbing stairs?: No Does the patient have difficulty dressing or bathing?: No Does the patient have difficulty doing errands alone such as visiting a doctor's office or shopping?: No    Physical Exam   General Appearance:    Alert, cooperative, no distress  Eyes:    PERRL, conjunctiva/corneas clear, EOM's intact       Lungs:     Clear to auscultation bilaterally, respirations unlabored  Heart:    Regular rate and rhythm  Neurologic:   Awake, alert, oriented x 3. No apparent focal neurological           defect.       Results for orders placed or performed in visit on 03/20/16  POCT glycosylated hemoglobin (Hb A1C)  Result Value Ref Range   Hemoglobin A1C 6.7    Est. average glucose Bld gHb Est-mCnc 146        Assessment & Plan:     1. Type 2 diabetes mellitus without complication, without long-term current use of insulin (HCC) Well controlled.  Continue current medications.   - POCT glycosylated hemoglobin (Hb A1C) - pioglitazone (ACTOS) 45 MG tablet; Take 1 tablet (45 mg total) by mouth daily. Please place prescription on hold until patient calls for refill  Dispense: 90 tablet; Refill: 3 - POCT UA - Microalbumin  Discussed Recommendations for statin therapy in diabetics including risk of side effects and cardiac benefits. Considering diabetes and blood pressure are well controlled and normal cholesterol readings he decided to defer statin therapy for the time being. He will continue taking daily ECASA.   3. Need for influenza vaccination  - Flu vaccine HIGH DOSE PF  Return in about 6 months (around 09/17/2016).       Lelon Huh, MD  [facility]

## 2016-06-29 ENCOUNTER — Other Ambulatory Visit: Payer: Self-pay | Admitting: Family Medicine

## 2016-06-29 DIAGNOSIS — E119 Type 2 diabetes mellitus without complications: Secondary | ICD-10-CM

## 2016-09-18 ENCOUNTER — Encounter: Payer: Self-pay | Admitting: Family Medicine

## 2016-09-18 ENCOUNTER — Ambulatory Visit (INDEPENDENT_AMBULATORY_CARE_PROVIDER_SITE_OTHER): Payer: Medicare Other | Admitting: Family Medicine

## 2016-09-18 VITALS — BP 118/62 | HR 73 | Temp 98.0°F | Resp 16 | Wt 213.0 lb

## 2016-09-18 DIAGNOSIS — E118 Type 2 diabetes mellitus with unspecified complications: Secondary | ICD-10-CM

## 2016-09-18 LAB — POCT GLYCOSYLATED HEMOGLOBIN (HGB A1C)
ESTIMATED AVERAGE GLUCOSE: 154
Hemoglobin A1C: 7

## 2016-09-18 NOTE — Progress Notes (Signed)
Patient: Mark Fitzpatrick Male    DOB: 02-22-1951   66 y.o.   MRN: [id number] Visit Date: 09/18/2016  Today's Provider: Lelon Huh, MD   Chief Complaint  Patient presents with  . Diabetes    follow up   Subjective:    HPI  Diabetes Mellitus Type II, Follow-up:   Lab Results  Component Value Date   HGBA1C 6.7 03/20/2016   HGBA1C 7.0 11/17/2015   HGBA1C 6.6 07/21/2015    Last seen for diabetes 6 months ago.  Management since then includes no changes. He reports good compliance with treatment. He is not having side effects.  Current symptoms include none and have been stable. Home blood sugar records: fasting range: 70-150  Episodes of hypoglycemia? yes - occasionally when he doesn't eat on time   Current Insulin Regimen: none Most Recent Eye Exam: 1 year ago Weight trend: stable Prior visit with dietician: no Current diet: in general, a "healthy" diet   Current exercise: none  Pertinent Labs:    Component Value Date/Time   CHOL 174 11/17/2015 1153   TRIG 100 11/17/2015 1153   HDL 63 11/17/2015 1153   LDLCALC 91 11/17/2015 1153   CREATININE 1.00 11/17/2015 1153    Wt Readings from Last 3 Encounters:  03/20/16 211 lb (95.7 kg)  11/17/15 208 lb (94.3 kg)  07/21/15 207 lb (93.9 kg)    ------------------------------------------------------------------------     No Known Allergies   Current Outpatient Prescriptions:  .  aspirin EC 81 MG tablet, Take 1 tablet (81 mg total) by mouth daily., Disp: 90 tablet, Rfl: 3 .  CINNAMON PO, Take 1,000 mg by mouth daily. , Disp: , Rfl:  .  glimepiride (AMARYL) 4 MG tablet, TAKE HALF OF A TABLET BY MOUTH TWICE A DAY, Disp: 90 tablet, Rfl: 4 .  JANUVIA 100 MG tablet, TAKE 1 TABLET BY MOUTH EVERY DAY, Disp: 90 tablet, Rfl: 3 .  metFORMIN (GLUCOPHAGE) 1000 MG tablet, TAKE 1 TABLET BY MOUTH TWICE A DAY, Disp: 180 tablet, Rfl: 3 .  pioglitazone (ACTOS) 45 MG tablet, Take 1 tablet (45 mg total) by mouth daily.  Please place prescription on hold until patient calls for refill, Disp: 90 tablet, Rfl: 3 .  sildenafil (VIAGRA) 50 MG tablet, Take 50 mg by mouth daily as needed for erectile dysfunction., Disp: , Rfl:   Review of Systems  Constitutional: Negative for appetite change, chills and fever.  Respiratory: Negative for chest tightness, shortness of breath and wheezing.   Cardiovascular: Negative for chest pain and palpitations.  Gastrointestinal: Negative for abdominal pain, nausea and vomiting.  Endocrine: Negative for cold intolerance, heat intolerance, polydipsia, polyphagia and polyuria.    Social History  Substance Use Topics  . Smoking status: Former Smoker    Packs/day: 0.25    Years: 5.00    Types: Cigarettes    Quit date: 06/30/1984  . Smokeless tobacco: Never Used     Comment: started smoking at age 47  . Alcohol use Yes     Comment: ocassional   Objective:   BP 118/62 (BP Location: Right Arm, Patient Position: Sitting, Cuff Size: Large)   Pulse 73   Temp 98 F (36.7 C) (Oral)   Resp 16   Wt 213 lb (96.6 kg)   BMI 29.71 kg/m  There were no vitals filed for this visit.   Physical Exam   General Appearance:    Alert, cooperative, no distress  Eyes:    PERRL,  conjunctiva/corneas clear, EOM's intact       Lungs:     Clear to auscultation bilaterally, respirations unlabored  Heart:    Regular rate and rhythm  Neurologic:   Awake, alert, oriented x 3. No apparent focal neurological           defect.        Results for orders placed or performed in visit on 09/18/16  POCT HgB A1C  Result Value Ref Range   Hemoglobin A1C 7.0    Est. average glucose Bld gHb Est-mCnc 154        Assessment & Plan:     1. Controlled type 2 diabetes mellitus with complication, without long-term current use of insulin (HCC) A1c up a bit today. He is going to get more strict with diet and start exercising. Follow up dM an CPE in 3 months.  - POCT HgB A1C       Lelon Huh, MD    [facility]

## 2016-10-21 ENCOUNTER — Ambulatory Visit (INDEPENDENT_AMBULATORY_CARE_PROVIDER_SITE_OTHER): Payer: Medicare Other | Admitting: Family Medicine

## 2016-10-21 ENCOUNTER — Encounter: Payer: Self-pay | Admitting: Family Medicine

## 2016-10-21 VITALS — BP 118/72 | HR 61 | Temp 98.1°F | Resp 16 | Ht 71.0 in | Wt 215.0 lb

## 2016-10-21 DIAGNOSIS — Z1211 Encounter for screening for malignant neoplasm of colon: Secondary | ICD-10-CM | POA: Diagnosis not present

## 2016-10-21 DIAGNOSIS — E119 Type 2 diabetes mellitus without complications: Secondary | ICD-10-CM | POA: Diagnosis not present

## 2016-10-21 DIAGNOSIS — R001 Bradycardia, unspecified: Secondary | ICD-10-CM | POA: Diagnosis not present

## 2016-10-21 DIAGNOSIS — Z0000 Encounter for general adult medical examination without abnormal findings: Secondary | ICD-10-CM | POA: Diagnosis not present

## 2016-10-21 DIAGNOSIS — Z23 Encounter for immunization: Secondary | ICD-10-CM | POA: Diagnosis not present

## 2016-10-21 NOTE — Progress Notes (Signed)
Patient: Mark Fitzpatrick, Male    DOB: 08/14/1950, 66 y.o.   MRN: [id number] Visit Date: 10/21/2016  Today's Provider: Lelon Huh, MD   Chief Complaint  Patient presents with  . Annual Exam  . Diabetes    follow up   Subjective:    Annual physical Mark Fitzpatrick is a 66 y.o. male. He feels fairly well. He reports exercising . He reports he is sleeping fairly well.  -----------------------------------------------------------  Diabetes Mellitus Type II, Follow-up:   Lab Results  Component Value Date   HGBA1C 7.0 09/18/2016   HGBA1C 6.7 03/20/2016   HGBA1C 7.0 11/17/2015    Last seen for diabetes 1 months ago.  Management since then includes counseling patient to get strict with diet and start exercising. He reports good compliance with treatment. He is not having side effects.  Current symptoms include none and have been stable. Home blood sugar records: not being checked regularly  Episodes of hypoglycemia? no   Current Insulin Regimen: none Most Recent Eye Exam:  1 year ago Weight trend: stable Prior visit with dietician: no Current diet: in general, a "healthy" diet   Current exercise: walking  Pertinent Labs:    Component Value Date/Time   CHOL 174 11/17/2015 1153   TRIG 100 11/17/2015 1153   HDL 63 11/17/2015 1153   LDLCALC 91 11/17/2015 1153   CREATININE 1.00 11/17/2015 1153    Wt Readings from Last 3 Encounters:  09/18/16 213 lb (96.6 kg)  03/20/16 211 lb (95.7 kg)  11/17/15 208 lb (94.3 kg)    ------------------------------------------------------------------------   Review of Systems  Constitutional: Negative for appetite change, chills, fatigue and fever.  HENT: Negative for congestion, ear pain, hearing loss, nosebleeds and trouble swallowing.   Eyes: Negative for pain and visual disturbance.  Respiratory: Negative for cough, chest tightness and shortness of breath.   Cardiovascular: Negative for chest pain, palpitations and  leg swelling.  Gastrointestinal: Negative for abdominal pain, blood in stool, constipation, diarrhea, nausea and vomiting.  Endocrine: Negative for polydipsia, polyphagia and polyuria.  Genitourinary: Negative for dysuria and flank pain.  Musculoskeletal: Negative for arthralgias, back pain, joint swelling, myalgias and neck stiffness.  Skin: Negative for color change, rash and wound.  Neurological: Negative for dizziness, tremors, seizures, speech difficulty, weakness, light-headedness and headaches.  Psychiatric/Behavioral: Negative for behavioral problems, confusion, decreased concentration, dysphoric mood and sleep disturbance. The patient is not nervous/anxious.   All other systems reviewed and are negative.   Social History   Social History  . Marital status: Married    Spouse name: N/A  . Number of children: N/A  . Years of education: H/S   Occupational History  . Retired    Social History Main Topics  . Smoking status: Former Smoker    Packs/day: 0.25    Years: 5.00    Types: Cigarettes    Quit date: 06/30/1984  . Smokeless tobacco: Never Used     Comment: started smoking at age 18  . Alcohol use Yes     Comment: ocassional  . Drug use: No  . Sexual activity: Not on file   Other Topics Concern  . Not on file   Social History Narrative  . No narrative on file    Past Medical History:  Diagnosis Date  . Bradycardia   . Bursitis of shoulder   . Diabetes mellitus without complication ([place])   . Erectile dysfunction   . Heart murmur    as  child  . History of broken nose   . Shoulder pain      Patient Active Problem List   Diagnosis Date Noted  . Bradycardia, sinus 12/27/2014  . Type 2 diabetes mellitus not at goal, with retinopathy 12/27/2014  . Failure of erection 12/27/2014  . Bradycardia 04/09/2013  . Premature atrial contractions 04/09/2013    Past Surgical History:  Procedure Laterality Date  . collar bone fracture    . COLONOSCOPY    .  RECONSTRUCTION OF NOSE  1996    His family history includes Bone cancer in his father; Cancer in his maternal grandfather; Dementia in his mother; Diabetes in his father and mother; Healthy in his brother and brother; Heart attack in his paternal grandfather; Hypertension in his mother; Ovarian cancer in his maternal grandmother.      Current Outpatient Prescriptions:  .  aspirin EC 81 MG tablet, Take 1 tablet (81 mg total) by mouth daily., Disp: 90 tablet, Rfl: 3 .  CINNAMON PO, Take 1,000 mg by mouth daily. , Disp: , Rfl:  .  glimepiride (AMARYL) 4 MG tablet, TAKE HALF OF A TABLET BY MOUTH TWICE A DAY, Disp: 90 tablet, Rfl: 4 .  JANUVIA 100 MG tablet, TAKE 1 TABLET BY MOUTH EVERY DAY, Disp: 90 tablet, Rfl: 3 .  metFORMIN (GLUCOPHAGE) 1000 MG tablet, TAKE 1 TABLET BY MOUTH TWICE A DAY, Disp: 180 tablet, Rfl: 3 .  pioglitazone (ACTOS) 45 MG tablet, Take 1 tablet (45 mg total) by mouth daily. Please place prescription on hold until patient calls for refill, Disp: 90 tablet, Rfl: 3 .  sildenafil (VIAGRA) 50 MG tablet, Take 50 mg by mouth daily as needed for erectile dysfunction., Disp: , Rfl:   Patient Care Team: Birdie Sons, MD as PCP - General (Family Medicine)     Objective:   Vitals: BP 118/72 (BP Location: Right Arm, Patient Position: Sitting, Cuff Size: Large)   Pulse 61   Temp 98.1 F (36.7 C) (Oral)   Resp 16   Ht 5\' 11"  (1.803 m)   Wt 215 lb (97.5 kg)   SpO2 97% Comment: room air  BMI 29.99 kg/m   Physical Exam   General Appearance:    Alert, cooperative, no distress, appears stated age  Head:    Normocephalic, without obvious abnormality, atraumatic  Eyes:    PERRL, conjunctiva/corneas clear, EOM's intact, fundi    benign, both eyes       Ears:    Normal TM's and external ear canals, both ears  Nose:   Nares normal, septum midline, mucosa normal, no drainage   or sinus tenderness  Throat:   Lips, mucosa, and tongue normal; teeth and gums normal  Neck:   Supple,  symmetrical, trachea midline, no adenopathy;       thyroid:  No enlargement/tenderness/nodules; no carotid   bruit or JVD  Back:     Symmetric, no curvature, ROM normal, no CVA tenderness  Lungs:     Clear to auscultation bilaterally, respirations unlabored  Chest wall:    No tenderness or deformity  Heart:    Regular rate and rhythm, S1 and S2 normal, no murmur, rub   or gallop  Abdomen:     Soft, non-tender, bowel sounds active all four quadrants,    no masses, no organomegaly  Genitalia:    deferred  Rectal:    deferred  Extremities:   Extremities normal, atraumatic, no cyanosis or edema  Pulses:   2+ and symmetric all  extremities  Skin:   Skin color, texture, turgor normal, no rashes or lesions  Lymph nodes:   Cervical, supraclavicular, and axillary nodes normal  Neurologic:   CNII-XII intact. Normal strength, sensation and reflexes      throughout   Diabetic Foot Exam - Simple   Simple Foot Form Diabetic Foot exam was performed with the following findings:  Yes 10/21/2016  9:33 AM  Visual Inspection No deformities, no ulcerations, no other skin breakdown bilaterally:  Yes Sensation Testing Intact to touch and monofilament testing bilaterally:  Yes Pulse Check Posterior Tibialis and Dorsalis pulse intact bilaterally:  Yes Comments     Activities of Daily Living In your present state of health, do you have any difficulty performing the following activities: 10/21/2016 03/20/2016  Hearing? N Y  Vision? N N  Difficulty concentrating or making decisions? N N  Walking or climbing stairs? N N  Dressing or bathing? N N  Doing errands, shopping? N N  Some recent data might be hidden    Fall Risk Assessment Fall Risk  10/21/2016 03/20/2016  Falls in the past year? No No     Depression Screen PHQ 2/9 Scores 10/21/2016 03/20/2016  PHQ - 2 Score 0 0  PHQ- 9 Score 0 0    Cognitive Testing - 6-CIT  Correct? Score   What year is it? yes 0 0 or 4  What month is it? yes 0 0 or 3    Memorize:    Pia Mau,  41,  [place],      What time is it? (within 1 hour) yes 0 0 or 3  Count backwards from 20 yes 0 0, 2, or 4  Name the months of the year yes 0 0, 2, or 4  Repeat name & address above yes 0 0, 2, 4, 6, 8, or 10       TOTAL SCORE  0/28   Interpretation:  Normal  Normal (0-7) Abnormal (8-28)    Audit-C Alcohol Use Screening  Question Answer Points  How often do you have alcoholic drink? 1 or more times monthly 1  On days you do drink alcohol, how many drinks do you typically consume? No more than 1 0  How oftey will you drink 6 or more in a total? never 0  Total Score:  1   A score of 3 or more in women, and 4 or more in men indicates increased risk for alcohol abuse, EXCEPT if all of the points are from question 1.  Current Exercise Habits: Home exercise routine, Type of exercise: walking, Time (Minutes): 60, Frequency (Times/Week): 7, Weekly Exercise (Minutes/Week): 420, Intensity: Mild Exercise limited by: None identified   Assessment & Plan:    Annual physical Reviewed patient's Family Medical History Reviewed and updated list of patient's medical providers Assessment of cognitive impairment was done Assessed patient's functional ability Established a written schedule for health screening [place] Completed and Reviewed  Exercise Activities and Dietary recommendations Goals    None      Immunization History  Administered Date(s) Administered  . Influenza, High Dose Seasonal PF 04/21/2015, 03/20/2016  . Pneumococcal Conjugate-13 04/21/2015  . Pneumococcal Polysaccharide-23 05/23/2010  . Tdap 12/27/2013  . Zoster 12/27/2013    Health Maintenance  Topic Date Due  . OPHTHALMOLOGY EXAM  10/25/2015  . PNA vac Low Risk Adult (2 of 2 - PPSV23) 04/20/2016  . FOOT EXAM  07/20/2016  . COLONOSCOPY  09/23/2016  .  INFLUENZA VACCINE  01/29/2017  . URINE MICROALBUMIN  03/20/2017  . HEMOGLOBIN A1C  03/21/2017  .  TETANUS/TDAP  12/28/2023  . Hepatitis C Screening  Completed     Discussed health benefits of physical activity, and encouraged him to engage in regular exercise appropriate for his age and condition.    ------------------------------------------------------------------------------------------------------------  1. Annual physical exam Doing well. Normal exam.   2. Need for pneumococcal vaccination  - Pneumococcal polysaccharide vaccine 23-valent greater than or equal to 2yo subcutaneous/IM  3. Controlled type 2 diabetes mellitus without complication, without long-term current use of insulin ([place]) Follow up diabetes/aic in 3 months.  He reports he is scheduled for diabetic eye exam next week.  - EKG 12-Lead  4. Colon cancer screening Last colonoscopy in 2008.  - Cologuard  5. Bradycardia, sinus  - EKG 12-Lead   Lelon Huh, MD  [facility]

## 2016-10-30 ENCOUNTER — Telehealth: Payer: Self-pay | Admitting: Family Medicine

## 2016-10-30 NOTE — Telephone Encounter (Signed)
Order for cologuard faxed to [facility] °

## 2016-11-08 LAB — HM DIABETES EYE EXAM

## 2016-12-10 LAB — COLOGUARD: Cologuard: NEGATIVE

## 2016-12-17 NOTE — Progress Notes (Signed)
Advised  ED 

## 2016-12-25 ENCOUNTER — Encounter: Payer: Medicare Other | Admitting: Family Medicine

## 2017-01-21 ENCOUNTER — Ambulatory Visit (INDEPENDENT_AMBULATORY_CARE_PROVIDER_SITE_OTHER): Payer: Medicare Other | Admitting: Family Medicine

## 2017-01-21 ENCOUNTER — Encounter: Payer: Self-pay | Admitting: Family Medicine

## 2017-01-21 VITALS — BP 110/60 | HR 66 | Temp 98.7°F | Resp 16 | Ht 71.0 in | Wt 210.0 lb

## 2017-01-21 DIAGNOSIS — Z125 Encounter for screening for malignant neoplasm of prostate: Secondary | ICD-10-CM

## 2017-01-21 DIAGNOSIS — E119 Type 2 diabetes mellitus without complications: Secondary | ICD-10-CM | POA: Diagnosis not present

## 2017-01-21 LAB — POCT GLYCOSYLATED HEMOGLOBIN (HGB A1C)
Est. average glucose Bld gHb Est-mCnc: 143
Hemoglobin A1C: 6.6

## 2017-01-21 NOTE — Progress Notes (Signed)
Patient: Mark Fitzpatrick Male    DOB: 1951-02-02   66 y.o.   MRN: [id number] Visit Date: 01/21/2017  Today's Provider: Lelon Huh, MD   Chief Complaint  Patient presents with  . Diabetes    follow up   Subjective:    HPI  Diabetes Mellitus Type II, Follow-up:   Lab Results  Component Value Date   HGBA1C 7.0 09/18/2016   HGBA1C 6.7 03/20/2016   HGBA1C 7.0 11/17/2015    Last seen for diabetes 3 months ago.  Management since then includes no changes. He reports good compliance with treatment. He is not having side effects.  Current symptoms include none and have been stable. Home blood sugar records: blood sugars rarely checked  Episodes of hypoglycemia? yes -   Current Insulin Regimen: none Most Recent Eye Exam: <1 year ago Weight trend: decreasing steadily Prior visit with dietician: no Current diet: in general, a "healthy" diet   Current exercise: yard work  Pertinent Labs:    Component Value Date/Time   CHOL 174 11/17/2015 1153   TRIG 100 11/17/2015 1153   HDL 63 11/17/2015 1153   LDLCALC 91 11/17/2015 1153   CREATININE 1.00 11/17/2015 1153    Wt Readings from Last 3 Encounters:  01/21/17 210 lb (95.3 kg)  10/21/16 215 lb (97.5 kg)  09/18/16 213 lb (96.6 kg)    ------------------------------------------------------------------------     No Known Allergies   Current Outpatient Prescriptions:  .  aspirin EC 81 MG tablet, Take 1 tablet (81 mg total) by mouth daily., Disp: 90 tablet, Rfl: 3 .  CINNAMON PO, Take 1,000 mg by mouth daily. , Disp: , Rfl:  .  glimepiride (AMARYL) 4 MG tablet, TAKE HALF OF A TABLET BY MOUTH TWICE A DAY (Patient taking differently: 1/2 tablet daily), Disp: 90 tablet, Rfl: 4 .  JANUVIA 100 MG tablet, TAKE 1 TABLET BY MOUTH EVERY DAY, Disp: 90 tablet, Rfl: 3 .  metFORMIN (GLUCOPHAGE) 1000 MG tablet, TAKE 1 TABLET BY MOUTH TWICE A DAY, Disp: 180 tablet, Rfl: 3 .  pioglitazone (ACTOS) 45 MG tablet, Take 1 tablet (45  mg total) by mouth daily. Please place prescription on hold until patient calls for refill, Disp: 90 tablet, Rfl: 3 .  sildenafil (VIAGRA) 50 MG tablet, Take 50 mg by mouth daily as needed for erectile dysfunction., Disp: , Rfl:   Review of Systems  Constitutional: Negative for appetite change, chills and fever.  Respiratory: Negative for chest tightness, shortness of breath and wheezing.   Cardiovascular: Negative for chest pain and palpitations.  Gastrointestinal: Negative for abdominal pain, nausea and vomiting.    Social History  Substance Use Topics  . Smoking status: Former Smoker    Packs/day: 0.25    Years: 5.00    Types: Cigarettes    Quit date: 06/30/1984  . Smokeless tobacco: Never Used     Comment: started smoking at age 26  . Alcohol use Yes     Comment: ocassional   Objective:   BP 110/60 (BP Location: Left Arm, Patient Position: Sitting, Cuff Size: Large)   Pulse 66   Temp 98.7 F (37.1 C) (Oral)   Resp 16   Ht 5\' 11"  (1.803 m)   Wt 210 lb (95.3 kg)   SpO2 96% Comment: room air  BMI 29.29 kg/m     Physical Exam   General Appearance:    Alert, cooperative, no distress  Eyes:    PERRL, conjunctiva/corneas clear, EOM's intact  Lungs:     Clear to auscultation bilaterally, respirations unlabored  Heart:    Regular rate and rhythm  Neurologic:   Awake, alert, oriented x 3. No apparent focal neurological           defect.       Results for orders placed or performed in visit on 01/21/17  POCT HgB A1C  Result Value Ref Range   Hemoglobin A1C 6.6    Est. average glucose Bld gHb Est-mCnc 143         Assessment & Plan:     1. Controlled type 2 diabetes mellitus without complication, without long-term current use of insulin ([place]) Well controlled.  Continue current medications.   - POCT HgB A1C - Comprehensive metabolic panel - Lipid panel  Return in about 6 months (around 07/24/2017) for diabetes.   2. Prostate cancer screening  - PSA        Lelon Huh, MD  [facility]

## 2017-01-25 LAB — COMPREHENSIVE METABOLIC PANEL
A/G RATIO: 1.9 (ref 1.2–2.2)
ALBUMIN: 4.6 g/dL (ref 3.6–4.8)
ALT: 11 IU/L (ref 0–44)
AST: 12 IU/L (ref 0–40)
Alkaline Phosphatase: 44 IU/L (ref 39–117)
BUN / CREAT RATIO: 13 (ref 10–24)
BUN: 15 mg/dL (ref 8–27)
Bilirubin Total: 0.7 mg/dL (ref 0.0–1.2)
CALCIUM: 9.8 mg/dL (ref 8.6–10.2)
CO2: 24 mmol/L (ref 20–29)
Chloride: 101 mmol/L (ref 96–106)
Creatinine, Ser: 1.2 mg/dL (ref 0.76–1.27)
GFR calc Af Amer: 72 mL/min/{1.73_m2} (ref >59)
GFR, EST NON AFRICAN AMERICAN: 63 mL/min/{1.73_m2} (ref >59)
GLOBULIN, TOTAL: 2.4 g/dL (ref 1.5–4.5)
Glucose: 177 mg/dL — ABNORMAL HIGH (ref 65–99)
POTASSIUM: 5.6 mmol/L — AB (ref 3.5–5.2)
SODIUM: 142 mmol/L (ref 134–144)
TOTAL PROTEIN: 7 g/dL (ref 6.0–8.5)

## 2017-01-25 LAB — PSA: PROSTATE SPECIFIC AG, SERUM: 1.1 ng/mL (ref 0.0–4.0)

## 2017-01-25 LAB — LIPID PANEL
CHOL/HDL RATIO: 2.7 ratio (ref 0.0–5.0)
Cholesterol, Total: 179 mg/dL (ref 100–199)
HDL: 66 mg/dL (ref >39)
LDL CALC: 91 mg/dL (ref 0–99)
Triglycerides: 109 mg/dL (ref 0–149)
VLDL Cholesterol Cal: 22 mg/dL (ref 5–40)

## 2017-02-21 ENCOUNTER — Other Ambulatory Visit: Payer: Self-pay | Admitting: Family Medicine

## 2017-02-21 DIAGNOSIS — E119 Type 2 diabetes mellitus without complications: Secondary | ICD-10-CM

## 2017-04-02 ENCOUNTER — Other Ambulatory Visit: Payer: Self-pay | Admitting: Family Medicine

## 2017-04-02 DIAGNOSIS — E119 Type 2 diabetes mellitus without complications: Secondary | ICD-10-CM

## 2017-05-15 ENCOUNTER — Ambulatory Visit (INDEPENDENT_AMBULATORY_CARE_PROVIDER_SITE_OTHER): Payer: Medicare Other

## 2017-05-15 DIAGNOSIS — Z23 Encounter for immunization: Secondary | ICD-10-CM | POA: Diagnosis not present

## 2017-07-24 ENCOUNTER — Encounter: Payer: Self-pay | Admitting: Family Medicine

## 2017-07-24 ENCOUNTER — Ambulatory Visit: Payer: Medicare Other | Admitting: Family Medicine

## 2017-07-24 VITALS — BP 118/60 | HR 68 | Temp 98.8°F | Resp 16 | Wt 211.0 lb

## 2017-07-24 DIAGNOSIS — E119 Type 2 diabetes mellitus without complications: Secondary | ICD-10-CM

## 2017-07-24 LAB — POCT UA - MICROALBUMIN: MICROALBUMIN (UR) POC: 20 mg/L

## 2017-07-24 LAB — POCT GLYCOSYLATED HEMOGLOBIN (HGB A1C)
ESTIMATED AVERAGE GLUCOSE: 148
Hemoglobin A1C: 6.8

## 2017-07-24 NOTE — Progress Notes (Signed)
Patient: Mark Fitzpatrick Male    DOB: 02/02/1951   67 y.o.   MRN: [id number] Visit Date: 07/24/2017  Today's Provider: Lelon Huh, MD   Chief Complaint  Patient presents with  . Diabetes   Subjective:    HPI   Diabetes Mellitus Type II, Follow-up:   Lab Results  Component Value Date   HGBA1C 6.6 01/21/2017   HGBA1C 7.0 09/18/2016   HGBA1C 6.7 03/20/2016   Last seen for diabetes 6 months ago.  Management since then includes; no changes. He reports good compliance with treatment. He is not having side effects.  Current symptoms include none and have been stable. Home blood sugar records: blood sugars are rarely checked  Episodes of hypoglycemia? no   Current Insulin Regimen: none Most Recent Eye Exam: 6 months ago Weight trend: stable Prior visit with dietician: no Current diet: well balanced Current exercise: none   Wt Readings from Last 3 Encounters:  07/24/17 211 lb (95.7 kg)  01/21/17 210 lb (95.3 kg)  10/21/16 215 lb (97.5 kg)     ------------------------------------------------------------------------    No Known Allergies   Current Outpatient Medications:  .  aspirin EC 81 MG tablet, Take 1 tablet (81 mg total) by mouth daily., Disp: 90 tablet, Rfl: 3 .  CINNAMON PO, Take 1,000 mg by mouth daily. , Disp: , Rfl:  .  glimepiride (AMARYL) 4 MG tablet, TAKE HALF OF A TABLET BY MOUTH TWICE A DAY (Patient taking differently: 1/2 tablet daily), Disp: 90 tablet, Rfl: 4 .  JANUVIA 100 MG tablet, TAKE 1 TABLET BY MOUTH EVERY DAY, Disp: 90 tablet, Rfl: 3 .  metFORMIN (GLUCOPHAGE) 1000 MG tablet, TAKE 1 TABLET BY MOUTH TWICE A DAY, Disp: 180 tablet, Rfl: 3 .  pioglitazone (ACTOS) 45 MG tablet, TAKE ONE TABLET BY MOUTH ONCE DAILY, Disp: 90 tablet, Rfl: 3 .  sildenafil (VIAGRA) 50 MG tablet, Take 50 mg by mouth daily as needed for erectile dysfunction., Disp: , Rfl:   Review of Systems  Constitutional: Negative for appetite change, chills and fever.    Respiratory: Negative for chest tightness, shortness of breath and wheezing.   Cardiovascular: Negative for chest pain and palpitations.  Gastrointestinal: Negative for abdominal pain, nausea and vomiting.    Social History   Tobacco Use  . Smoking status: Former Smoker    Packs/day: 0.25    Years: 5.00    Pack years: 1.25    Types: Cigarettes    Last attempt to quit: 06/30/1984    Years since quitting: 33.0  . Smokeless tobacco: Never Used  . Tobacco comment: started smoking at age 60  Substance Use Topics  . Alcohol use: Yes    Comment: ocassional   Objective:   BP 118/60 (BP Location: Left Arm, Patient Position: Sitting, Cuff Size: Normal)   Pulse 68   Temp 98.8 F (37.1 C) (Oral)   Resp 16   Wt 211 lb (95.7 kg)   SpO2 96% Comment: room air  BMI 29.43 kg/m  There were no vitals filed for this visit.   Physical Exam    General Appearance:    Alert, cooperative, no distress  Eyes:    PERRL, conjunctiva/corneas clear, EOM's intact       Lungs:     Clear to auscultation bilaterally, respirations unlabored  Heart:    Regular rate and rhythm  Neurologic:   Awake, alert, oriented x 3. No apparent focal neurological  defect.        Results for orders placed or performed in visit on 07/24/17  POCT HgB A1C  Result Value Ref Range   Hemoglobin A1C 6.8    Est. average glucose Bld gHb Est-mCnc 148   POCT UA - Microalbumin  Result Value Ref Range   Microalbumin Ur, POC 20 mg/L   Creatinine, POC n/a mg/dL   Albumin/Creatinine Ratio, Urine, POC n/a        Assessment & Plan:     1. Controlled type 2 diabetes mellitus without complication, without long-term current use of insulin ([place]) Well controlled.  Continue current medications.   - POCT HgB A1C - POCT UA - Microalbumin  Return in about 6 months (around 01/21/2018) for Yearly Physical.       Lelon Huh, MD  [facility]

## 2017-08-14 ENCOUNTER — Other Ambulatory Visit: Payer: Self-pay | Admitting: Family Medicine

## 2017-08-14 DIAGNOSIS — E119 Type 2 diabetes mellitus without complications: Secondary | ICD-10-CM

## 2017-12-25 ENCOUNTER — Ambulatory Visit (INDEPENDENT_AMBULATORY_CARE_PROVIDER_SITE_OTHER): Payer: Medicare Other

## 2017-12-25 VITALS — BP 120/56 | HR 61 | Temp 99.3°F | Ht 71.0 in | Wt 208.0 lb

## 2017-12-25 DIAGNOSIS — Z0000 Encounter for general adult medical examination without abnormal findings: Secondary | ICD-10-CM

## 2017-12-25 NOTE — Progress Notes (Addendum)
Subjective:   Mark Fitzpatrick is a 67 y.o. male who presents for an Initial Medicare Annual Wellness Visit.  Review of Systems  N/A  Cardiac Risk Factors include: advanced age (>69men, >58 women);dyslipidemia;diabetes mellitus;male gender;obesity (BMI >30kg/m2)    Objective:    Today's Vitals   12/25/17 1448  BP: (!) 120/56  Pulse: 61  Temp: 99.3 F (37.4 C)  TempSrc: Oral  Weight: 208 lb (94.3 kg)  Height: 5\' 11"  (1.803 m)  PainSc: 0-No pain   Body mass index is 29.01 kg/m.  Advanced Directives 12/25/2017  Does Patient Have a Medical Advance Directive? No  Would patient like information on creating a medical advance directive? No - Patient declined    Current Medications (verified) Outpatient Encounter Medications as of 12/25/2017  Medication Sig  . aspirin EC 81 MG tablet Take 1 tablet (81 mg total) by mouth daily.  [name] Kitchen CINNAMON PO Take 1,000 mg by mouth 2 (two) times daily.   [name] Kitchen glimepiride (AMARYL) 4 MG tablet TAKE HALF OF A TABLET BY MOUTH TWICE A DAY  . JANUVIA 100 MG tablet TAKE 1 TABLET BY MOUTH EVERY DAY  . metFORMIN (GLUCOPHAGE) 1000 MG tablet TAKE 1 TABLET BY MOUTH TWICE A DAY  . pioglitazone (ACTOS) 45 MG tablet TAKE ONE TABLET BY MOUTH ONCE DAILY  . sildenafil (VIAGRA) 50 MG tablet Take 50 mg by mouth daily as needed for erectile dysfunction.   No facility-administered encounter medications on file as of 12/25/2017.     Allergies (verified) Patient has no known allergies.   History: Past Medical History:  Diagnosis Date  . Bradycardia   . Bursitis of shoulder   . Diabetes mellitus without complication ([place])   . Erectile dysfunction   . Heart murmur    as child  . History of broken nose   . Shoulder pain    Past Surgical History:  Procedure Laterality Date  . collar bone fracture    . COLONOSCOPY    . RECONSTRUCTION OF NOSE  1996   Family History  Problem Relation Age of Onset  . Diabetes Mother   . Dementia Mother   . Hypertension  Mother   . Bone cancer Father        spread to liver  . Diabetes Father   . Heart attack Paternal Grandfather   . Healthy Brother   . Ovarian cancer Maternal Grandmother   . Cancer Maternal Grandfather        unknown  . Healthy Brother    Social History   Socioeconomic History  . Marital status: Married    Spouse name: Not on file  . Number of children: 0  . Years of education: H/S  . Highest education level: 12th grade  Occupational History  . Occupation: Retired  Scientific laboratory technician  . Financial resource strain: Not hard at all  . Food insecurity:    Worry: Never true    Inability: Never true  . Transportation needs:    Medical: No    Non-medical: No  Tobacco Use  . Smoking status: Former Smoker    Packs/day: 0.25    Years: 5.00    Pack years: 1.25    Types: Cigarettes    Last attempt to quit: 06/30/1984    Years since quitting: 33.5  . Smokeless tobacco: Never Used  . Tobacco comment: started smoking at age 12  Substance and Sexual Activity  . Alcohol use: Not Currently  . Drug use: No  . Sexual activity: Not  on file  Lifestyle  . Physical activity:    Days per week: Not on file    Minutes per session: Not on file  . Stress: Not at all  Relationships  . Social connections:    Talks on phone: Not on file    Gets together: Not on file    Attends religious service: Not on file    Active member of club or organization: Not on file    Attends meetings of clubs or organizations: Not on file    Relationship status: Not on file  Other Topics Concern  . Not on file  Social History Narrative  . Not on file   Tobacco Counseling Counseling given: Not Answered Comment: started smoking at age 37   Clinical Intake:  Pre-visit preparation completed: Yes  Pain : No/denies pain Pain Score: 0-No pain     Nutritional Status: BMI 25 -29 Overweight Nutritional Risks: None Diabetes: Yes(type 2) CBG done?: No Did pt. bring in CBG monitor from home?: No  How often  do you need to have someone help you when you read instructions, pamphlets, or other written materials from your doctor or pharmacy?: 1 - Never  Interpreter Needed?: No  Information entered by :: [facility], LPN  Activities of Daily Living In your present state of health, do you have any difficulty performing the following activities: 12/25/2017  Hearing? Y  Comment Does not wear hearing aids.   Vision? N  Difficulty concentrating or making decisions? N  Walking or climbing stairs? N  Dressing or bathing? N  Doing errands, shopping? N  Preparing Food and eating ? N  Using the Toilet? N  In the past six months, have you accidently leaked urine? N  Do you have problems with loss of bowel control? N  Managing your Medications? N  Managing your Finances? N  Housekeeping or managing your Housekeeping? N  Some recent data might be hidden     Immunizations and Health Maintenance Immunization History  Administered Date(s) Administered  . Influenza Split 03/12/2012  . Influenza, High Dose Seasonal PF 04/21/2015, 03/20/2016, 05/15/2017  . Influenza,inj,Quad PF,6+ Mos 04/14/2013, 04/25/2014  . Pneumococcal Conjugate-13 04/21/2015  . Pneumococcal Polysaccharide-23 05/23/2010, 10/21/2016  . Tdap 12/25/2006, 12/27/2013  . Zoster 12/27/2013   Health Maintenance Due  Topic Date Due  . FOOT EXAM  10/21/2017  . OPHTHALMOLOGY EXAM  11/08/2017    Patient Care Team: Birdie Sons, MD as PCP - General (Family Medicine) Birder Robson, MD as Referring Physician (Ophthalmology)  Indicate any recent Medical Services you may have received from other than [facility] providers in the past year (date may be approximate).    Assessment:   This is a routine wellness examination for [place].  Hearing/Vision screen Vision Screening Comments: Pt sees Dr George Ina for vision checks yearly.   Dietary issues and exercise activities discussed: Current Exercise Habits: The patient does not participate  in regular exercise at present, Exercise limited by: None identified  Goals    . DIET - INCREASE WATER INTAKE     Recommend increasing water intake to 4-6 glasses a day.       Depression Screen PHQ 2/9 Scores 12/25/2017 12/25/2017 10/21/2016 03/20/2016  PHQ - 2 Score 0 0 0 0  PHQ- 9 Score 0 - 0 0    Fall Risk Fall Risk  12/25/2017 10/21/2016 03/20/2016  Falls in the past year? No No No    Is the patient's home free of loose throw rugs in walkways, pet  beds, electrical cords, etc?   yes      Grab bars in the bathroom? yes      Handrails on the stairs?   yes      Adequate lighting? yes  Timed Get Up and Go performed: N/A  Cognitive Function: Pt declined screening today.         Screening Tests Health Maintenance  Topic Date Due  . FOOT EXAM  10/21/2017  . OPHTHALMOLOGY EXAM  11/08/2017  . HEMOGLOBIN A1C  01/21/2018  . INFLUENZA VACCINE  01/29/2018  . URINE MICROALBUMIN  07/24/2018  . Fecal DNA (Cologuard)  12/10/2019  . TETANUS/TDAP  12/28/2023  . Hepatitis C Screening  Completed  . PNA vac Low Risk Adult  Completed    Qualifies for Shingles Vaccine? Due for Shingles vaccine. Declined my offer to administer today. Education has been provided regarding the importance of this vaccine. Pt has been advised to call her insurance company to determine her out of pocket expense. Advised she may also receive this vaccine at her local pharmacy or Health Dept. Verbalized acceptance and understanding.  Cancer Screenings: Lung: Low Dose CT Chest recommended if Age 65-80 years, 30 pack-year currently smoking OR have quit w/in 15years. Patient does not qualify. Colorectal: Up to date  Additional Screenings:  Hepatitis C Screening: Up to date      Plan:  I have personally reviewed and addressed the Medicare Annual Wellness questionnaire and have noted the following in the patient's chart:  A. Medical and social history B. Use of alcohol, tobacco or illicit drugs  C. Current  medications and supplements D. Functional ability and status E.  Nutritional status F.  Physical activity G. Advance directives H. List of other physicians I.  Hospitalizations, surgeries, and ER visits in previous 12 months J.  [place] such as hearing and vision if needed, cognitive and depression L. Referrals and appointments - none  In addition, I have reviewed and discussed with patient certain preventive protocols, quality metrics, and best practice recommendations. A written personalized care plan for preventive services as well as general preventive health recommendations were provided to patient.  See attached scanned questionnaire for additional information.   Signed,  Fabio Neighbors, LPN Nurse Health Advisor   Nurse Recommendations: Pt needs a diabetic foot exam at next OV. Pt plans to set up an eye exam by the end of the year.

## 2017-12-25 NOTE — Patient Instructions (Addendum)
Mr. Mark Fitzpatrick , Thank you for taking time to come for your Medicare Wellness Visit. I appreciate your ongoing commitment to your health goals. Please review the following plan we discussed and let me know if I can assist you in the future.   Screening recommendations/referrals: Colonoscopy: Up to date Recommended yearly ophthalmology/optometry visit for glaucoma screening and checkup Recommended yearly dental visit for hygiene and checkup  Vaccinations: Influenza vaccine: Up to date Pneumococcal vaccine: Up to date Tdap vaccine: Up to date Shingles vaccine: Pt declines today.     Advanced directives: Advance directive discussed with you today. Even though you declined this today please call our office should you change your mind and we can give you the proper paperwork for you to fill out.  Conditions/risks identified: Obesity recommend increasing water intake to 4-6 glasses a day.   Next appointment: 01/23/18 @ 10 AM with Dr Caryn Section.  Preventive Care 11 Years and Older, Male Preventive care refers to lifestyle choices and visits with your health care provider that can promote health and wellness. What does preventive care include?  A yearly physical exam. This is also called an annual well check.  Dental exams once or twice a year.  Routine eye exams. Ask your health care provider how often you should have your eyes checked.  Personal lifestyle choices, including:  Daily care of your teeth and gums.  Regular physical activity.  Eating a healthy diet.  Avoiding tobacco and drug use.  Limiting alcohol use.  Practicing safe sex.  Taking low doses of aspirin every day.  Taking vitamin and mineral supplements as recommended by your health care provider. What happens during an annual well check? The services and screenings done by your health care provider during your annual well check will depend on your age, overall health, lifestyle risk factors, and family history of  disease. Counseling  Your health care provider may ask you questions about your:  Alcohol use.  Tobacco use.  Drug use.  Emotional well-being.  Home and relationship well-being.  Sexual activity.  Eating habits.  History of falls.  Memory and ability to understand (cognition).  Work and work Statistician. Screening  You may have the following tests or measurements:  Height, weight, and BMI.  Blood pressure.  Lipid and cholesterol levels. These may be checked every 5 years, or more frequently if you are over 85 years old.  Skin check.  Lung cancer screening. You may have this screening every year starting at age 68 if you have a 30-pack-year history of smoking and currently smoke or have quit within the past 15 years.  Fecal occult blood test (FOBT) of the stool. You may have this test every year starting at age 68.  Flexible sigmoidoscopy or colonoscopy. You may have a sigmoidoscopy every 5 years or a colonoscopy every 10 years starting at age 68.  Prostate cancer screening. Recommendations will vary depending on your family history and other risks.  Hepatitis C blood test.  Hepatitis B blood test.  Sexually transmitted disease (STD) testing.  Diabetes screening. This is done by checking your blood sugar (glucose) after you have not eaten for a while (fasting). You may have this done every 1-3 years.  Abdominal aortic aneurysm (AAA) screening. You may need this if you are a current or former smoker.  Osteoporosis. You may be screened starting at age 32 if you are at high risk. Talk with your health care provider about your test results, treatment options, and if necessary, the  need for more tests. Vaccines  Your health care provider may recommend certain vaccines, such as:  Influenza vaccine. This is recommended every year.  Tetanus, diphtheria, and acellular pertussis (Tdap, Td) vaccine. You may need a Td booster every 10 years.  Zoster vaccine. You may  need this after age 62.  Pneumococcal 13-valent conjugate (PCV13) vaccine. One dose is recommended after age 23.  Pneumococcal polysaccharide (PPSV23) vaccine. One dose is recommended after age 68. Talk to your health care provider about which screenings and vaccines you need and how often you need them. This information is not intended to replace advice given to you by your health care provider. Make sure you discuss any questions you have with your health care provider. Document Released: 07/14/2015 Document Revised: 03/06/2016 Document Reviewed: 04/18/2015 Elsevier Interactive Patient Education  2017 [place] Prevention in the Home Falls can cause injuries. They can happen to people of all ages. There are many things you can do to make your home safe and to help prevent falls. What can I do on the outside of my home?  Regularly fix the edges of walkways and driveways and fix any cracks.  Remove anything that might make you trip as you walk through a door, such as a raised step or threshold.  Trim any bushes or trees on the path to your home.  Use bright outdoor lighting.  Clear any walking paths of anything that might make someone trip, such as rocks or tools.  Regularly check to see if handrails are loose or broken. Make sure that both sides of any steps have handrails.  Any raised decks and porches should have guardrails on the edges.  Have any leaves, snow, or ice cleared regularly.  Use sand or salt on walking paths during winter.  Clean up any spills in your garage right away. This includes oil or grease spills. What can I do in the bathroom?  Use night lights.  Install grab bars by the toilet and in the tub and shower. Do not use towel bars as grab bars.  Use non-skid mats or decals in the tub or shower.  If you need to sit down in the shower, use a plastic, non-slip stool.  Keep the floor dry. Clean up any water that spills on the floor as soon as it  happens.  Remove soap buildup in the tub or shower regularly.  Attach bath mats securely with double-sided non-slip rug tape.  Do not have throw rugs and other things on the floor that can make you trip. What can I do in the bedroom?  Use night lights.  Make sure that you have a light by your bed that is easy to reach.  Do not use any sheets or blankets that are too big for your bed. They should not hang down onto the floor.  Have a firm chair that has side arms. You can use this for support while you get dressed.  Do not have throw rugs and other things on the floor that can make you trip. What can I do in the kitchen?  Clean up any spills right away.  Avoid walking on wet floors.  Keep items that you use a lot in easy-to-reach places.  If you need to reach something above you, use a strong step stool that has a grab bar.  Keep electrical cords out of the way.  Do not use floor polish or wax that makes floors slippery. If you must use wax,  use non-skid floor wax.  Do not have throw rugs and other things on the floor that can make you trip. What can I do with my stairs?  Do not leave any items on the stairs.  Make sure that there are handrails on both sides of the stairs and use them. Fix handrails that are broken or loose. Make sure that handrails are as long as the stairways.  Check any carpeting to make sure that it is firmly attached to the stairs. Fix any carpet that is loose or worn.  Avoid having throw rugs at the top or bottom of the stairs. If you do have throw rugs, attach them to the floor with carpet tape.  Make sure that you have a light switch at the top of the stairs and the bottom of the stairs. If you do not have them, ask someone to add them for you. What else can I do to help prevent falls?  Wear shoes that:  Do not have high heels.  Have rubber bottoms.  Are comfortable and fit you well.  Are closed at the toe. Do not wear sandals.  If you  use a stepladder:  Make sure that it is fully opened. Do not climb a closed stepladder.  Make sure that both sides of the stepladder are locked into place.  Ask someone to hold it for you, if possible.  Clearly mark and make sure that you can see:  Any grab bars or handrails.  First and last steps.  Where the edge of each step is.  Use tools that help you move around (mobility aids) if they are needed. These include:  Canes.  Walkers.  Scooters.  Crutches.  Turn on the lights when you go into a dark area. Replace any light bulbs as soon as they burn out.  Set up your furniture so you have a clear path. Avoid moving your furniture around.  If any of your floors are uneven, fix them.  If there are any pets around you, be aware of where they are.  Review your medicines with your doctor. Some medicines can make you feel dizzy. This can increase your chance of falling. Ask your doctor what other things that you can do to help prevent falls. This information is not intended to replace advice given to you by your health care provider. Make sure you discuss any questions you have with your health care provider. Document Released: 04/13/2009 Document Revised: 11/23/2015 Document Reviewed: 07/22/2014 Elsevier Interactive Patient Education  2017 [employer].

## 2018-01-22 NOTE — Progress Notes (Signed)
Patient: Mark Fitzpatrick, Male    DOB: 15-Dec-1950, 67 y.o.   MRN: [id number] Visit Date: 01/23/2018  Today's Provider: Lelon Huh, MD   Chief Complaint  Patient presents with  . Annual Exam   Subjective:   Patient saw [facility] for AWV on 12/25/2017.   Complete Physical Mark Fitzpatrick is a 67 y.o. male. He feels well. He reports exercising slightly. He reports he is sleeping well.  -----------------------------------------------------------  Diabetes Mellitus Type II, Follow-up:   Lab Results  Component Value Date   HGBA1C 6.8 07/24/2017   HGBA1C 6.6 01/21/2017   HGBA1C 7.0 09/18/2016   Last seen for diabetes 6 months ago.  Management since then includes; no changes. He reports good compliance with treatment. He is not having side effects.  Current symptoms include none  Home blood sugar records: fasting range: 130's  Episodes of hypoglycemia? no   Current Insulin Regimen: none Most Recent Eye Exam: 11/08/2016. Patient scheduled for 03/2018 at [facility] Weight trend: stable Prior visit with dietician: no Current diet: well balanced Current exercise: none  ------------------------------------------------------------------------  Review of Systems  Constitutional: Negative.   HENT: Negative.   Eyes: Negative.   Respiratory: Negative.   Cardiovascular: Negative.   Endocrine: Negative.   Genitourinary: Negative.   Musculoskeletal: Negative.   Skin: Negative.   Allergic/Immunologic: Negative.   Neurological: Negative.   Hematological: Negative.   Psychiatric/Behavioral: Negative.     Social History   Socioeconomic History  . Marital status: Married    Spouse name: Not on file  . Number of children: 0  . Years of education: H/S  . Highest education level: 12th grade  Occupational History  . Occupation: Retired  Scientific laboratory technician  . Financial resource strain: Not hard at all  . Food insecurity:    Worry: Never true    Inability: Never true  .  Transportation needs:    Medical: No    Non-medical: No  Tobacco Use  . Smoking status: Former Smoker    Packs/day: 0.25    Years: 5.00    Pack years: 1.25    Types: Cigarettes    Last attempt to quit: 06/30/1984    Years since quitting: 33.5  . Smokeless tobacco: Never Used  . Tobacco comment: started smoking at age 60  Substance and Sexual Activity  . Alcohol use: Not Currently  . Drug use: No  . Sexual activity: Not on file  Lifestyle  . Physical activity:    Days per week: Not on file    Minutes per session: Not on file  . Stress: Not at all  Relationships  . Social connections:    Talks on phone: Not on file    Gets together: Not on file    Attends religious service: Not on file    Active member of club or organization: Not on file    Attends meetings of clubs or organizations: Not on file    Relationship status: Not on file  . Intimate partner violence:    Fear of current or ex partner: Not on file    Emotionally abused: Not on file    Physically abused: Not on file    Forced sexual activity: Not on file  Other Topics Concern  . Not on file  Social History Narrative  . Not on file    Past Medical History:  Diagnosis Date  . Bradycardia   . Bursitis of shoulder   . Diabetes mellitus without complication ([place])   .  Erectile dysfunction   . Heart murmur    as child  . History of broken nose   . Shoulder pain      Patient Active Problem List   Diagnosis Date Noted  . Bradycardia, sinus 12/27/2014  . Diabetes mellitus type 2, controlled ([place]) 12/27/2014  . Failure of erection 12/27/2014  . Premature atrial contractions 04/09/2013    Past Surgical History:  Procedure Laterality Date  . collar bone fracture    . COLONOSCOPY    . RECONSTRUCTION OF NOSE  1996    His family history includes Bone cancer in his father; Cancer in his maternal grandfather; Dementia in his mother; Diabetes in his father and mother; Healthy in his brother and brother; Heart  attack in his paternal grandfather; Hypertension in his mother; Ovarian cancer in his maternal grandmother.      Current Outpatient Medications:  .  aspirin EC 81 MG tablet, Take 1 tablet (81 mg total) by mouth daily., Disp: 90 tablet, Rfl: 3 .  CINNAMON PO, Take 1,000 mg by mouth 2 (two) times daily. , Disp: , Rfl:  .  glimepiride (AMARYL) 4 MG tablet, TAKE HALF OF A TABLET BY MOUTH TWICE A DAY, Disp: 90 tablet, Rfl: 3 .  JANUVIA 100 MG tablet, TAKE 1 TABLET BY MOUTH EVERY DAY, Disp: 90 tablet, Rfl: 3 .  metFORMIN (GLUCOPHAGE) 1000 MG tablet, TAKE 1 TABLET BY MOUTH TWICE A DAY, Disp: 180 tablet, Rfl: 3 .  pioglitazone (ACTOS) 45 MG tablet, TAKE ONE TABLET BY MOUTH ONCE DAILY, Disp: 90 tablet, Rfl: 3 .  sildenafil (VIAGRA) 50 MG tablet, Take 50 mg by mouth daily as needed for erectile dysfunction., Disp: , Rfl:   Patient Care Team: Birdie Sons, MD as PCP - General (Family Medicine) Birder Robson, MD as Referring Physician (Ophthalmology)     Objective:   Vitals: BP (!) 110/58 (BP Location: Left Arm, Patient Position: Sitting, Cuff Size: Normal)   Pulse 69   Temp 98.2 F (36.8 C) (Oral)   Wt 208 lb (94.3 kg)   SpO2 97%   BMI 29.01 kg/m   Physical Exam   General Appearance:    Alert, cooperative, no distress, appears stated age  Head:    Normocephalic, without obvious abnormality, atraumatic  Eyes:    PERRL, conjunctiva/corneas clear, EOM's intact, fundi    benign, both eyes       Ears:    Normal TM's. Both canals obstructed by cerumen.   Nose:   Nares normal, septum midline, mucosa normal, no drainage   or sinus tenderness  Throat:   Lips, mucosa, and tongue normal; teeth and gums normal  Neck:   Supple, symmetrical, trachea midline, no adenopathy;       thyroid:  No enlargement/tenderness/nodules; no carotid   bruit or JVD  Back:     Symmetric, no curvature, ROM normal, no CVA tenderness  Lungs:     Clear to auscultation bilaterally, respirations unlabored  Chest  wall:    No tenderness or deformity  Heart:    Regular rate and rhythm, S1 and S2 normal, no murmur, rub   or gallop  Abdomen:     Soft, non-tender, bowel sounds active all four quadrants,    no masses, no organomegaly  Genitalia:    deferred  Rectal:    deferred  Extremities:   Extremities normal, atraumatic, no cyanosis or edema  Pulses:   2+ and symmetric all extremities  Skin:   Skin color, texture, turgor normal,  no rashes or lesions  Lymph nodes:   Cervical, supraclavicular, and axillary nodes normal  Neurologic:   CNII-XII intact. Normal strength, sensation and reflexes      throughout    Activities of Daily Living In your present state of health, do you have any difficulty performing the following activities: 12/25/2017  Hearing? Y  Comment Does not wear hearing aids.   Vision? N  Difficulty concentrating or making decisions? N  Walking or climbing stairs? N  Dressing or bathing? N  Doing errands, shopping? N  Preparing Food and eating ? N  Using the Toilet? N  In the past six months, have you accidently leaked urine? N  Do you have problems with loss of bowel control? N  Managing your Medications? N  Managing your Finances? N  Housekeeping or managing your Housekeeping? N  Some recent data might be hidden    Fall Risk Assessment Fall Risk  12/25/2017 10/21/2016 03/20/2016  Falls in the past year? No No No     Depression Screen PHQ 2/9 Scores 12/25/2017 12/25/2017 10/21/2016 03/20/2016  PHQ - 2 Score 0 0 0 0  PHQ- 9 Score 0 - 0 0   Results for orders placed or performed in visit on 01/23/18  POCT HgB A1C  Result Value Ref Range   Hemoglobin A1C 6.8 (A) 4.0 - 5.6 %   HbA1c POC (<> result, manual entry)  4.0 - 5.6 %   HbA1c, POC (prediabetic range)  5.7 - 6.4 %   HbA1c, POC (controlled diabetic range)  0.0 - 7.0 %    Assessment & Plan:    Annual Physical Reviewed patient's Family Medical History Reviewed and updated list of patient's medical  providers Assessment of cognitive impairment was done Assessed patient's functional ability Established a written schedule for health screening [place] Completed and Reviewed  Exercise Activities and Dietary recommendations Goals    . DIET - INCREASE WATER INTAKE     Recommend increasing water intake to 4-6 glasses a day.        Immunization History  Administered Date(s) Administered  . Influenza Split 03/12/2012  . Influenza, High Dose Seasonal PF 04/21/2015, 03/20/2016, 05/15/2017  . Influenza,inj,Quad PF,6+ Mos 04/14/2013, 04/25/2014  . Pneumococcal Conjugate-13 04/21/2015  . Pneumococcal Polysaccharide-23 05/23/2010, 10/21/2016  . Tdap 12/25/2006, 12/27/2013  . Zoster 12/27/2013    Health Maintenance  Topic Date Due  . FOOT EXAM  10/21/2017  . OPHTHALMOLOGY EXAM  11/08/2017  . HEMOGLOBIN A1C  01/21/2018  . INFLUENZA VACCINE  01/29/2018  . URINE MICROALBUMIN  07/24/2018  . Fecal DNA (Cologuard)  12/10/2019  . TETANUS/TDAP  12/28/2023  . Hepatitis C Screening  Completed  . PNA vac Low Risk Adult  Completed     Discussed health benefits of physical activity, and encouraged him to engage in regular exercise appropriate for his age and condition.    ------------------------------------------------------------------------------------------------------------  2. Controlled type 2 diabetes mellitus without complication, without long-term current use of insulin ([place]) Well controlled.  Continue current medications.   Counseled regarding recommendation for statin therapy to reduce risk of heart disease in people with diabetes. Will likely start medium intensity statin unless LDL is very low.  - POCT HgB A1C - Comprehensive metabolic panel - Lipid panel  3. Prostate cancer screening   - PSA  4. Bilateral impacted cerumen He does report he has not been hearing well, so after soaking with Debrox, ear canals were irrigated with water until clear.  Patient tolerated procedure  well.     Lelon Huh, MD  [facility]

## 2018-01-23 ENCOUNTER — Ambulatory Visit (INDEPENDENT_AMBULATORY_CARE_PROVIDER_SITE_OTHER): Payer: Medicare Other | Admitting: Family Medicine

## 2018-01-23 ENCOUNTER — Encounter: Payer: Self-pay | Admitting: Family Medicine

## 2018-01-23 VITALS — BP 110/58 | HR 69 | Temp 98.2°F | Wt 208.0 lb

## 2018-01-23 DIAGNOSIS — Z125 Encounter for screening for malignant neoplasm of prostate: Secondary | ICD-10-CM

## 2018-01-23 DIAGNOSIS — H6123 Impacted cerumen, bilateral: Secondary | ICD-10-CM | POA: Diagnosis not present

## 2018-01-23 DIAGNOSIS — Z0001 Encounter for general adult medical examination with abnormal findings: Secondary | ICD-10-CM

## 2018-01-23 DIAGNOSIS — E119 Type 2 diabetes mellitus without complications: Secondary | ICD-10-CM | POA: Diagnosis not present

## 2018-01-23 DIAGNOSIS — Z0000 Encounter for general adult medical examination without abnormal findings: Secondary | ICD-10-CM

## 2018-01-23 LAB — POCT GLYCOSYLATED HEMOGLOBIN (HGB A1C): HEMOGLOBIN A1C: 6.8 % — AB (ref 4.0–5.6)

## 2018-02-03 ENCOUNTER — Telehealth: Payer: Self-pay | Admitting: *Deleted

## 2018-02-03 LAB — COMPREHENSIVE METABOLIC PANEL
ALK PHOS: 49 IU/L (ref 39–117)
ALT: 13 IU/L (ref 0–44)
AST: 14 IU/L (ref 0–40)
Albumin/Globulin Ratio: 1.9 (ref 1.2–2.2)
Albumin: 4.6 g/dL (ref 3.6–4.8)
BILIRUBIN TOTAL: 0.6 mg/dL (ref 0.0–1.2)
BUN / CREAT RATIO: 12 (ref 10–24)
BUN: 13 mg/dL (ref 8–27)
CHLORIDE: 105 mmol/L (ref 96–106)
CO2: 24 mmol/L (ref 20–29)
Calcium: 9.5 mg/dL (ref 8.6–10.2)
Creatinine, Ser: 1.08 mg/dL (ref 0.76–1.27)
GFR calc Af Amer: 82 mL/min/{1.73_m2} (ref >59)
GFR calc non Af Amer: 71 mL/min/{1.73_m2} (ref >59)
GLUCOSE: 160 mg/dL — AB (ref 65–99)
Globulin, Total: 2.4 g/dL (ref 1.5–4.5)
Potassium: 5 mmol/L (ref 3.5–5.2)
Sodium: 145 mmol/L — ABNORMAL HIGH (ref 134–144)
Total Protein: 7 g/dL (ref 6.0–8.5)

## 2018-02-03 LAB — LIPID PANEL
CHOLESTEROL TOTAL: 166 mg/dL (ref 100–199)
Chol/HDL Ratio: 2.8 ratio (ref 0.0–5.0)
HDL: 60 mg/dL (ref >39)
LDL Calculated: 86 mg/dL (ref 0–99)
Triglycerides: 98 mg/dL (ref 0–149)
VLDL CHOLESTEROL CAL: 20 mg/dL (ref 5–40)

## 2018-02-03 LAB — PSA: Prostate Specific Ag, Serum: 1.3 ng/mL (ref 0.0–4.0)

## 2018-02-03 NOTE — Telephone Encounter (Signed)
-----   Message from Birdie Sons, MD sent at 02/03/2018  8:43 AM EDT ----- Labs normal except LDL cholesterol is 86, but needs to be under 70 since he has diabetes. Need to start atorvastatin 10mg  once a day, #30, rf x 4. Continue other medication unchanged. Schedule follow up in 4 months for diabetes and labs.

## 2018-02-03 NOTE — Telephone Encounter (Signed)
Patient's wife Deirdre Peer was notified of results. Deirdre Peer stated she will give pt his results and have him call us back with ok to fill rx. Also pt will schedule 4 month f/u stat that time.

## 2018-02-05 MED ORDER — ATORVASTATIN CALCIUM 10 MG PO TABS: 30.0000 mg | ORAL_TABLET | Freq: Every day | ORAL | 4 refills | Status: DC

## 2018-02-05 NOTE — Telephone Encounter (Signed)
Pt agreed to start atorvastatin 10mg .  I sent RX to [facility] and made follow up apt for 06/09/2018   Thanks,   -Mickel Baas

## 2018-02-06 ENCOUNTER — Telehealth: Payer: Self-pay | Admitting: Family Medicine

## 2018-02-06 MED ORDER — ATORVASTATIN CALCIUM 10 MG PO TABS: 10.0000 mg | ORAL_TABLET | Freq: Every day | ORAL | 4 refills | Status: DC

## 2018-02-06 NOTE — Telephone Encounter (Signed)
FYI! Resent rx with correct instructions.

## 2018-02-06 NOTE — Telephone Encounter (Signed)
Pt's wife called the pharmacy can not fill his order for  Atorvastatin 10 mg three times a day.  The insurance does not approve of the way the rx is written.  It is suppose to be taken once a day and it comes in 10 mg, 20 mg, 40, and 80 mg  [facility] [place]  Pt's wife wants you to call her.  She does not understand the way you prescribed the medication.  She did pick up the medication but had to pay a lot more because ins denied.    CB# [phone]  Thanks Con Memos

## 2018-02-15 ENCOUNTER — Other Ambulatory Visit: Payer: Self-pay | Admitting: Family Medicine

## 2018-02-15 DIAGNOSIS — E119 Type 2 diabetes mellitus without complications: Secondary | ICD-10-CM

## 2018-03-26 ENCOUNTER — Other Ambulatory Visit: Payer: Self-pay | Admitting: Family Medicine

## 2018-03-26 DIAGNOSIS — E119 Type 2 diabetes mellitus without complications: Secondary | ICD-10-CM

## 2018-04-01 LAB — HM DIABETES EYE EXAM

## 2018-04-03 ENCOUNTER — Encounter: Payer: Self-pay | Admitting: *Deleted

## 2018-05-02 ENCOUNTER — Other Ambulatory Visit: Payer: Self-pay | Admitting: Family Medicine

## 2018-05-13 ENCOUNTER — Ambulatory Visit (INDEPENDENT_AMBULATORY_CARE_PROVIDER_SITE_OTHER): Payer: Medicare Other

## 2018-05-13 DIAGNOSIS — Z23 Encounter for immunization: Secondary | ICD-10-CM | POA: Diagnosis not present

## 2018-06-09 ENCOUNTER — Ambulatory Visit: Payer: Medicare Other | Admitting: Family Medicine

## 2018-06-09 ENCOUNTER — Encounter: Payer: Self-pay | Admitting: Family Medicine

## 2018-06-09 VITALS — BP 120/62 | HR 60 | Temp 98.6°F | Resp 16 | Wt 214.0 lb

## 2018-06-09 DIAGNOSIS — E113299 Type 2 diabetes mellitus with mild nonproliferative diabetic retinopathy without macular edema, unspecified eye: Secondary | ICD-10-CM

## 2018-06-09 DIAGNOSIS — M2550 Pain in unspecified joint: Secondary | ICD-10-CM | POA: Diagnosis not present

## 2018-06-09 NOTE — Progress Notes (Signed)
Patient: Mark Fitzpatrick Male    DOB: 08/30/1950   67 y.o.   MRN: [id number] Visit Date: 06/09/2018  Today's Provider: Lelon Huh, MD   Chief Complaint  Patient presents with  . Diabetes  . Hyperlipidemia   Subjective:    HPI  Diabetes Mellitus Type II, Follow-up:   Lab Results  Component Value Date   HGBA1C 6.8 (A) 01/23/2018   HGBA1C 6.8 07/24/2017   HGBA1C 6.6 01/21/2017    Last seen for diabetes 4 months ago.  Management since then includes no changes. He reports good compliance with treatment. He is not having side effects.  Current symptoms include none and have been stable. Home blood sugar records: fasting range: 150-170  Episodes of hypoglycemia? no   Current Insulin Regimen: none Most Recent Eye Exam: 04/01/2018 Weight trend: increasing steadily Prior visit with dietician: no Current diet: well balanced Current exercise: none  Pertinent Labs:    Component Value Date/Time   CHOL 166 02/02/2018 1216   TRIG 98 02/02/2018 1216   HDL 60 02/02/2018 1216   LDLCALC 86 02/02/2018 1216   CREATININE 1.08 02/02/2018 1216    Wt Readings from Last 3 Encounters:  06/09/18 214 lb (97.1 kg)  01/23/18 208 lb (94.3 kg)  12/25/17 208 lb (94.3 kg)    ------------------------------------------------------------------------  Lipid/Cholesterol, Follow-up:   Last seen for this 4 months ago.  Management changes since that visit include starting Atorvastatin . [name] Kitchen Last Lipid Panel:    Component Value Date/Time   CHOL 166 02/02/2018 1216   TRIG 98 02/02/2018 1216   HDL 60 02/02/2018 1216   CHOLHDL 2.8 02/02/2018 1216   LDLCALC 86 02/02/2018 1216    Risk factors for vascular disease include diabetes mellitus and hypercholesterolemia  He reports good compliance with treatment. He is having side effects (joint aches and pain since starting Atorvastatin).  Mainly in his wrists, knuckles and knees.  Current symptoms include none and have been  stable. Weight trend: increasing steadily Prior visit with dietician: no Current diet: well balanced Current exercise: none  Wt Readings from Last 3 Encounters:  06/09/18 214 lb (97.1 kg)  01/23/18 208 lb (94.3 kg)  12/25/17 208 lb (94.3 kg)    -------------------------------------------------------------------     No Known Allergies   Current Outpatient Medications:  .  aspirin EC 81 MG tablet, Take 1 tablet (81 mg total) by mouth daily., Disp: 90 tablet, Rfl: 3 .  atorvastatin (LIPITOR) 10 MG tablet, TAKE 1 TABLET BY MOUTH EVERY DAY, Disp: 90 tablet, Rfl: 4 .  CINNAMON PO, Take 1,000 mg by mouth 2 (two) times daily. , Disp: , Rfl:  .  glimepiride (AMARYL) 4 MG tablet, TAKE HALF OF A TABLET BY MOUTH TWICE A DAY (Patient taking differently: Take 2 mg by mouth daily. ), Disp: 90 tablet, Rfl: 3 .  JANUVIA 100 MG tablet, TAKE 1 TABLET BY MOUTH EVERY DAY, Disp: 90 tablet, Rfl: 3 .  metFORMIN (GLUCOPHAGE) 1000 MG tablet, TAKE 1 TABLET BY MOUTH TWICE A DAY, Disp: 180 tablet, Rfl: 3 .  pioglitazone (ACTOS) 45 MG tablet, TAKE ONE TABLET BY MOUTH ONCE DAILY, Disp: 90 tablet, Rfl: 3 .  sildenafil (VIAGRA) 50 MG tablet, Take 50 mg by mouth daily as needed for erectile dysfunction., Disp: , Rfl:   Review of Systems  Constitutional: Negative for appetite change, chills and fever.  Respiratory: Negative for chest tightness, shortness of breath and wheezing.   Cardiovascular: Negative for chest pain  and palpitations.  Gastrointestinal: Negative for abdominal pain, nausea and vomiting.  Musculoskeletal: Positive for arthralgias.    Social History   Tobacco Use  . Smoking status: Former Smoker    Packs/day: 0.25    Years: 5.00    Pack years: 1.25    Types: Cigarettes    Last attempt to quit: 06/30/1984    Years since quitting: 33.9  . Smokeless tobacco: Never Used  . Tobacco comment: started smoking at age 63  Substance Use Topics  . Alcohol use: Not Currently   Objective:   BP  120/62 (BP Location: Left Arm, Patient Position: Sitting, Cuff Size: Large)   Pulse 60   Temp 98.6 F (37 C) (Oral)   Resp 16   Wt 214 lb (97.1 kg)   SpO2 96% Comment: room air  BMI 29.85 kg/m  Vitals:   06/09/18 0937  BP: 120/62  Pulse: 60  Resp: 16  Temp: 98.6 F (37 C)  TempSrc: Oral  SpO2: 96%  Weight: 214 lb (97.1 kg)     Physical Exam   General Appearance:    Alert, cooperative, no distress  Eyes:    PERRL, conjunctiva/corneas clear, EOM's intact       Lungs:     Clear to auscultation bilaterally, respirations unlabored  Heart:    Regular rate and rhythm  Neurologic:   Awake, alert, oriented x 3. No apparent focal neurological           defect.          Assessment & Plan:     1. Controlled type 2 diabetes mellitus with mild nonproliferative retinopathy without macular edema, without long-term current use of insulin, unspecified laterality ([place]) Having some mild arthralgias since starting statin, otherwise tolerating well. Check labs.  - Lipid panel - Comprehensive metabolic panel - Hemoglobin A1c - CK (Creatine Kinase)  2. Arthralgia, unspecified joint  - CK (Creatine Kinase) - Uric acid      Lelon Huh, MD  [facility]

## 2018-06-10 ENCOUNTER — Telehealth: Payer: Self-pay

## 2018-06-10 LAB — COMPREHENSIVE METABOLIC PANEL WITH GFR
ALT: 19 IU/L (ref 0–44)
AST: 15 IU/L (ref 0–40)
Albumin/Globulin Ratio: 2 (ref 1.2–2.2)
Albumin: 4.4 g/dL (ref 3.6–4.8)
Alkaline Phosphatase: 50 IU/L (ref 39–117)
BUN/Creatinine Ratio: 14 (ref 10–24)
BUN: 15 mg/dL (ref 8–27)
Bilirubin Total: 0.5 mg/dL (ref 0.0–1.2)
CO2: 23 mmol/L (ref 20–29)
Calcium: 9.2 mg/dL (ref 8.6–10.2)
Chloride: 102 mmol/L (ref 96–106)
Creatinine, Ser: 1.04 mg/dL (ref 0.76–1.27)
GFR calc Af Amer: 85 mL/min/1.73
GFR calc non Af Amer: 73 mL/min/1.73
Globulin, Total: 2.2 g/dL (ref 1.5–4.5)
Glucose: 171 mg/dL — ABNORMAL HIGH (ref 65–99)
Potassium: 4.5 mmol/L (ref 3.5–5.2)
Sodium: 140 mmol/L (ref 134–144)
Total Protein: 6.6 g/dL (ref 6.0–8.5)

## 2018-06-10 LAB — CK: CK TOTAL: 106 U/L (ref 24–204)

## 2018-06-10 LAB — LIPID PANEL
Chol/HDL Ratio: 1.9 ratio (ref 0.0–5.0)
Cholesterol, Total: 131 mg/dL (ref 100–199)
HDL: 68 mg/dL
LDL Calculated: 49 mg/dL (ref 0–99)
Triglycerides: 71 mg/dL (ref 0–149)
VLDL Cholesterol Cal: 14 mg/dL (ref 5–40)

## 2018-06-10 LAB — HEMOGLOBIN A1C
ESTIMATED AVERAGE GLUCOSE: 154 mg/dL
Hgb A1c MFr Bld: 7 % — ABNORMAL HIGH (ref 4.8–5.6)

## 2018-06-10 LAB — URIC ACID: Uric Acid: 5.3 mg/dL (ref 3.7–8.6)

## 2018-06-10 NOTE — Telephone Encounter (Signed)
-----   Message from Birdie Sons, MD sent at 06/10/2018  8:21 AM EST ----- a1c up slightly to 7.0. LDL cholesterol is much better, down to 49, need to keep under 70. Continue same medications. Follow up for diabetes in about 4 months.

## 2018-06-10 NOTE — Telephone Encounter (Signed)
LMTCB 06/10/2018  Thanks,   -Mickel Baas

## 2018-06-11 NOTE — Telephone Encounter (Signed)
Patient advised of results and verbally voiced understanding. Patient wants to know if he can push his Diabetes follow up out to every 6 months? Please advise.

## 2018-06-11 NOTE — Telephone Encounter (Signed)
That's fine

## 2018-06-11 NOTE — Telephone Encounter (Signed)
Spoke to pt and scheduled his 6 month fup and also scheduled his CPE.  dbs

## 2018-10-17 ENCOUNTER — Other Ambulatory Visit: Payer: Self-pay | Admitting: Family Medicine

## 2018-10-17 DIAGNOSIS — E119 Type 2 diabetes mellitus without complications: Secondary | ICD-10-CM

## 2018-12-07 ENCOUNTER — Encounter: Payer: Self-pay | Admitting: Family Medicine

## 2018-12-07 ENCOUNTER — Ambulatory Visit: Payer: Medicare Other | Admitting: Family Medicine

## 2018-12-07 ENCOUNTER — Other Ambulatory Visit: Payer: Self-pay

## 2018-12-07 VITALS — BP 132/60 | HR 66 | Temp 98.4°F | Resp 16 | Ht 71.0 in | Wt 218.0 lb

## 2018-12-07 DIAGNOSIS — E113299 Type 2 diabetes mellitus with mild nonproliferative diabetic retinopathy without macular edema, unspecified eye: Secondary | ICD-10-CM | POA: Diagnosis not present

## 2018-12-07 LAB — POCT GLYCOSYLATED HEMOGLOBIN (HGB A1C)
Est. average glucose Bld gHb Est-mCnc: 174
Hemoglobin A1C: 7.7 % — AB (ref 4.0–5.6)

## 2018-12-07 LAB — POCT UA - MICROALBUMIN: Microalbumin Ur, POC: 20 mg/L

## 2018-12-07 NOTE — Progress Notes (Signed)
Patient: Mark Fitzpatrick Male    DOB: December 10, 1950   68 y.o.   MRN: [id number] Visit Date: 12/07/2018  Today's Provider: Lelon Huh, MD   Chief Complaint  Patient presents with  . Diabetes   Subjective:     HPI  Diabetes Mellitus Type II, Follow-up:   Lab Results  Component Value Date   HGBA1C 7.0 (H) 06/09/2018   HGBA1C 6.8 (A) 01/23/2018   HGBA1C 6.8 07/24/2017    Last seen for diabetes 6 months ago.  Management since then includes no changes. He reports good compliance with treatment. He is not having side effects.  Current symptoms include none and have been stable. Home blood sugar records: blood sugars are not checked   Episodes of hypoglycemia? no   Current Insulin Regimen: none Most Recent Eye Exam: 04/01/2018 Weight trend: increasing steadily Prior visit with dietician: No Current exercise: none Has been much less active since pandemic started.  Current diet habits: well balanced  Pertinent Labs:    Component Value Date/Time   CHOL 131 06/09/2018 1017   TRIG 71 06/09/2018 1017   HDL 68 06/09/2018 1017   LDLCALC 49 06/09/2018 1017   CREATININE 1.04 06/09/2018 1017    Wt Readings from Last 3 Encounters:  12/07/18 218 lb (98.9 kg)  06/09/18 214 lb (97.1 kg)  01/23/18 208 lb (94.3 kg)    ------------------------------------------------------------------------  No Known Allergies   Current Outpatient Medications:  .  aspirin EC 81 MG tablet, Take 1 tablet (81 mg total) by mouth daily., Disp: 90 tablet, Rfl: 3 .  atorvastatin (LIPITOR) 10 MG tablet, TAKE 1 TABLET BY MOUTH EVERY DAY, Disp: 90 tablet, Rfl: 4 .  CINNAMON PO, Take 1,000 mg by mouth 2 (two) times daily. , Disp: , Rfl:  .  ELDERBERRY PO, Take 1 Dose by mouth daily., Disp: , Rfl:  .  glimepiride (AMARYL) 4 MG tablet, Take 0.5 tablets (2 mg total) by mouth daily., Disp: 30 tablet, Rfl: 5 .  JANUVIA 100 MG tablet, TAKE 1 TABLET BY MOUTH EVERY DAY, Disp: 90 tablet, Rfl: 3 .   metFORMIN (GLUCOPHAGE) 1000 MG tablet, TAKE 1 TABLET BY MOUTH TWICE A DAY, Disp: 180 tablet, Rfl: 3 .  pioglitazone (ACTOS) 45 MG tablet, TAKE ONE TABLET BY MOUTH ONCE DAILY, Disp: 90 tablet, Rfl: 3 .  sildenafil (VIAGRA) 50 MG tablet, Take 50 mg by mouth daily as needed for erectile dysfunction., Disp: , Rfl:   Review of Systems  Constitutional: Negative for appetite change, chills and fever.  Respiratory: Negative for chest tightness, shortness of breath and wheezing.   Cardiovascular: Negative for chest pain and palpitations.  Gastrointestinal: Negative for abdominal pain, nausea and vomiting.    Social History   Tobacco Use  . Smoking status: Former Smoker    Packs/day: 0.25    Years: 5.00    Pack years: 1.25    Types: Cigarettes    Last attempt to quit: 06/30/1984    Years since quitting: 34.4  . Smokeless tobacco: Never Used  . Tobacco comment: started smoking at age 70  Substance Use Topics  . Alcohol use: Not Currently      Objective:   BP 132/60 (BP Location: Left Arm, Patient Position: Sitting, Cuff Size: Large)   Pulse 66   Temp 98.4 F (36.9 C) (Oral)   Resp 16   Ht 5\' 11"  (1.803 m)   Wt 218 lb (98.9 kg)   SpO2 96% Comment: room  air  BMI 30.40 kg/m  Vitals:   12/07/18 1113  BP: 132/60  Pulse: 66  Resp: 16  Temp: 98.4 F (36.9 C)  TempSrc: Oral  SpO2: 96%  Weight: 218 lb (98.9 kg)  Height: 5\' 11"  (1.803 m)     Physical Exam  General Appearance:    Alert, cooperative, no distress, obese  Eyes:    PERRL, conjunctiva/corneas clear, EOM's intact       Lungs:     Clear to auscultation bilaterally, respirations unlabored  Heart:    Regular rate and rhythm  Neurologic:   Awake, alert, oriented x 3. No apparent focal neurological           defect.       Results for orders placed or performed in visit on 12/07/18  POCT HgB A1C  Result Value Ref Range   Hemoglobin A1C 7.7 (A) 4.0 - 5.6 %   HbA1c POC (<> result, manual entry)     HbA1c, POC  (prediabetic range)     HbA1c, POC (controlled diabetic range)     Est. average glucose Bld gHb Est-mCnc 174   POCT UA - Microalbumin  Result Value Ref Range   Microalbumin Ur, POC 20 mg/L   Creatinine, POC n/a mg/dL   Albumin/Creatinine Ratio, Urine, POC n/a        Assessment & Plan    1. Controlled type 2 diabetes mellitus with mild nonproliferative retinopathy without macular edema, without long-term current use of insulin, unspecified laterality ([place]) Well controlled, but trending up, along with weight. He is planning on starting to get a regular exercise regiment. Continue current medications.   - POCT HgB A1C - POCT UA - Microalbumin  Follow up about 4 months.     Lelon Huh, MD  [facility]

## 2018-12-07 NOTE — Patient Instructions (Signed)
.   Please review the attached list of medications and notify my office if there are any errors.   . Please bring all of your medications to every appointment so we can make sure that our medication list is the same as yours.   

## 2018-12-28 ENCOUNTER — Ambulatory Visit: Payer: Medicare Other

## 2019-01-27 ENCOUNTER — Encounter: Payer: Self-pay | Admitting: Family Medicine

## 2019-02-03 ENCOUNTER — Other Ambulatory Visit: Payer: Self-pay | Admitting: Family Medicine

## 2019-02-03 DIAGNOSIS — E119 Type 2 diabetes mellitus without complications: Secondary | ICD-10-CM

## 2019-03-18 ENCOUNTER — Other Ambulatory Visit: Payer: Self-pay | Admitting: Family Medicine

## 2019-03-18 DIAGNOSIS — E119 Type 2 diabetes mellitus without complications: Secondary | ICD-10-CM

## 2019-04-02 LAB — HM DIABETES EYE EXAM

## 2019-04-13 ENCOUNTER — Telehealth: Payer: Self-pay | Admitting: Family Medicine

## 2019-04-13 NOTE — Telephone Encounter (Signed)
This pt has his appt Wed with Risk manager.  He wants to know if his wife who is a pt of Dr. Sabino Snipes, Deirdre Peer (DOB 02[birth year]12[birth year]54) can come to get her flu shot too at that time? He asked if we would ask.  Please let pt know.  Thanks, [employer]

## 2019-04-14 ENCOUNTER — Other Ambulatory Visit: Payer: Self-pay

## 2019-04-14 ENCOUNTER — Ambulatory Visit (INDEPENDENT_AMBULATORY_CARE_PROVIDER_SITE_OTHER): Payer: Medicare Other | Admitting: Family Medicine

## 2019-04-14 ENCOUNTER — Ambulatory Visit: Payer: Self-pay | Admitting: Family Medicine

## 2019-04-14 ENCOUNTER — Encounter: Payer: Self-pay | Admitting: Family Medicine

## 2019-04-14 VITALS — BP 120/60 | HR 63 | Temp 96.6°F | Resp 16 | Wt 216.0 lb

## 2019-04-14 DIAGNOSIS — E11319 Type 2 diabetes mellitus with unspecified diabetic retinopathy without macular edema: Secondary | ICD-10-CM

## 2019-04-14 DIAGNOSIS — Z125 Encounter for screening for malignant neoplasm of prostate: Secondary | ICD-10-CM

## 2019-04-14 DIAGNOSIS — Z0000 Encounter for general adult medical examination without abnormal findings: Secondary | ICD-10-CM

## 2019-04-14 DIAGNOSIS — Z23 Encounter for immunization: Secondary | ICD-10-CM | POA: Diagnosis not present

## 2019-04-14 NOTE — Progress Notes (Signed)
Patient: Mark Fitzpatrick, Male    DOB: 10/24/1950, 68 y.o.   MRN: [id number] Visit Date: 04/14/2019  Today's Provider: Lelon Huh, MD   Chief Complaint  Patient presents with  . Medicare Wellness  . Diabetes  . Hyperlipidemia   Subjective:     Annual physical exam Mark Fitzpatrick is a 68 y.o. male who presents today for health maintenance and complete physical. He feels fairly well. He reports exercising daily, walks 20-30 minutes. He reports he is sleeping fairly well.  -----------------------------------------------------------------    Review of Systems  Constitutional: Negative for chills, diaphoresis and fever.  HENT: Negative for congestion, ear discharge, ear pain, hearing loss, nosebleeds, sore throat and tinnitus.   Eyes: Negative for photophobia, pain, discharge and redness.  Respiratory: Negative for cough, shortness of breath, wheezing and stridor.   Cardiovascular: Negative for chest pain, palpitations and leg swelling.  Gastrointestinal: Negative for abdominal pain, blood in stool, constipation, diarrhea, nausea and vomiting.  Endocrine: Negative for polydipsia.  Genitourinary: Negative for dysuria, flank pain, frequency, hematuria and urgency.  Musculoskeletal: Negative for back pain, myalgias and neck pain.  Skin: Negative for rash.  Allergic/Immunologic: Negative for environmental allergies.  Neurological: Negative for dizziness, tremors, seizures, weakness and headaches.  Hematological: Does not bruise/bleed easily.  Psychiatric/Behavioral: Negative for hallucinations and suicidal ideas. The patient is not nervous/anxious.     Social History      He  reports that he quit smoking about 34 years ago. His smoking use included cigarettes. He has a 1.25 pack-year smoking history. He has never used smokeless tobacco. He reports previous alcohol use. He reports that he does not use drugs.       Social History   Socioeconomic History  . Marital  status: Married    Spouse name: Not on file  . Number of children: 0  . Years of education: H/S  . Highest education level: 12th grade  Occupational History  . Occupation: Retired  Scientific laboratory technician  . Financial resource strain: Not hard at all  . Food insecurity    Worry: Never true    Inability: Never true  . Transportation needs    Medical: No    Non-medical: No  Tobacco Use  . Smoking status: Former Smoker    Packs/day: 0.25    Years: 5.00    Pack years: 1.25    Types: Cigarettes    Quit date: 06/30/1984    Years since quitting: 34.8  . Smokeless tobacco: Never Used  . Tobacco comment: started smoking at age 33  Substance and Sexual Activity  . Alcohol use: Not Currently  . Drug use: No  . Sexual activity: Not on file  Lifestyle  . Physical activity    Days per week: Not on file    Minutes per session: Not on file  . Stress: Not at all  Relationships  . Social Herbalist on phone: Not on file    Gets together: Not on file    Attends religious service: Not on file    Active member of club or organization: Not on file    Attends meetings of clubs or organizations: Not on file    Relationship status: Not on file  Other Topics Concern  . Not on file  Social History Narrative  . Not on file    Past Medical History:  Diagnosis Date  . Bradycardia   . Bursitis of shoulder   . Diabetes mellitus  without complication ([place])   . Erectile dysfunction   . Heart murmur    as child  . History of broken nose   . Hx of dysplastic nevus 07/13/2012   L lat infrapectoral costal area, moderate  . Shoulder pain      Patient Active Problem List   Diagnosis Date Noted  . Bradycardia, sinus 12/27/2014  . Diabetes mellitus type 2 with retinopathy ([place]) 12/27/2014  . Failure of erection 12/27/2014  . Premature atrial contractions 04/09/2013    Past Surgical History:  Procedure Laterality Date  . collar bone fracture    . COLONOSCOPY    . RECONSTRUCTION OF NOSE   1996    Family History        Family Status  Relation Name Status  . Mother  Alive  . Father  Deceased at age 25  . PGF  Deceased at age 72s       MI  . Brother  Alive  . MGM  Deceased  . MGF  Deceased at age 28s  . PGM  Deceased at age 56  . Brother  Alive        His family history includes Bone cancer in his father; Cancer in his maternal grandfather; Dementia in his mother; Diabetes in his father and mother; Healthy in his brother and brother; Heart attack in his paternal grandfather; Hypertension in his mother; Ovarian cancer in his maternal grandmother.      No Known Allergies   Current Outpatient Medications:  .  aspirin EC 81 MG tablet, Take 1 tablet (81 mg total) by mouth daily., Disp: 90 tablet, Rfl: 3 .  atorvastatin (LIPITOR) 10 MG tablet, TAKE 1 TABLET BY MOUTH EVERY DAY, Disp: 90 tablet, Rfl: 4 .  CINNAMON PO, Take 1,000 mg by mouth 2 (two) times daily. , Disp: , Rfl:  .  ELDERBERRY PO, Take 1 Dose by mouth daily., Disp: , Rfl:  .  glimepiride (AMARYL) 4 MG tablet, Take 0.5 tablets (2 mg total) by mouth daily., Disp: 30 tablet, Rfl: 5 .  JANUVIA 100 MG tablet, TAKE 1 TABLET BY MOUTH EVERY DAY, Disp: 90 tablet, Rfl: 3 .  metFORMIN (GLUCOPHAGE) 1000 MG tablet, TAKE 1 TABLET BY MOUTH TWICE A DAY, Disp: 180 tablet, Rfl: 3 .  pioglitazone (ACTOS) 45 MG tablet, Take 1 tablet (45 mg total) by mouth daily., Disp: 90 tablet, Rfl: 0 .  sildenafil (VIAGRA) 50 MG tablet, Take 50 mg by mouth daily as needed for erectile dysfunction., Disp: , Rfl:    Patient Care Team: Birdie Sons, MD as PCP - General (Family Medicine) Birder Robson, MD as Referring Physician (Ophthalmology)    Objective:    Vitals: BP 120/60 (BP Location: Left Arm, Patient Position: Sitting, Cuff Size: Large)   Pulse 63   Temp (!) 96.6 F (35.9 C) (Temporal)   Resp 16   Wt 216 lb (98 kg)   SpO2 99% Comment: room air  BMI 30.13 kg/m    Vitals:   04/14/19 0908  BP: 120/60  Pulse: 63   Resp: 16  Temp: (!) 96.6 F (35.9 C)  TempSrc: Temporal  SpO2: 99%  Weight: 216 lb (98 kg)     Physical Exam   General Appearance:    Obese male. Alert, cooperative, in no acute distress, appears stated age  Head:    Normocephalic, without obvious abnormality, atraumatic  Eyes:    PERRL, conjunctiva/corneas clear, EOM's intact, fundi    benign, both eyes  Ears:    Normal TM's and external ear canals, both ears  Nose:   Nares normal, septum midline, mucosa normal, no drainage   or sinus tenderness  Throat:   Lips, mucosa, and tongue normal; teeth and gums normal  Neck:   Supple, symmetrical, trachea midline, no adenopathy;       thyroid:  No enlargement/tenderness/nodules; no carotid   bruit or JVD  Back:     Symmetric, no curvature, ROM normal, no CVA tenderness  Lungs:     Clear to auscultation bilaterally, respirations unlabored  Chest wall:    No tenderness or deformity  Heart:    Normal heart rate. Normal rhythm. No murmurs, rubs, or gallops.  S1 and S2 normal  Abdomen:     Soft, non-tender, bowel sounds active all four quadrants,    no masses, no organomegaly  Genitalia:    deferred  Rectal:    deferred  Extremities:   All extremities are intact. No cyanosis or edema  Pulses:   2+ and symmetric all extremities  Skin:   Skin color, texture, turgor normal, no rashes or lesions  Lymph nodes:   Cervical, supraclavicular, and axillary nodes normal  Neurologic:   CNII-XII intact. Normal strength, sensation and reflexes      throughout    Depression Screen PHQ 2/9 Scores 04/14/2019 12/25/2017 12/25/2017 10/21/2016  PHQ - 2 Score 0 0 0 0  PHQ- 9 Score - 0 - 0       Assessment & Plan:     Routine Health Maintenance and Physical Exam  Exercise Activities and Dietary recommendations Goals    . DIET - INCREASE WATER INTAKE     Recommend increasing water intake to 4-6 glasses a day.        Immunization History  Administered Date(s) Administered  . Influenza  Split 03/12/2012  . Influenza, High Dose Seasonal PF 04/21/2015, 03/20/2016, 05/15/2017, 05/13/2018  . Influenza,inj,Quad PF,6+ Mos 04/14/2013, 04/25/2014  . Pneumococcal Conjugate-13 04/21/2015  . Pneumococcal Polysaccharide-23 05/23/2010, 10/21/2016  . Tdap 12/25/2006, 12/27/2013  . Zoster 12/27/2013  . Zoster Recombinat (Shingrix) 02/05/2018, 08/04/2018    Health Maintenance  Topic Date Due  . FOOT EXAM  10/21/2017  . INFLUENZA VACCINE  01/30/2019  . HEMOGLOBIN A1C  06/08/2019  . URINE MICROALBUMIN  12/07/2019  . Fecal DNA (Cologuard)  12/10/2019  . OPHTHALMOLOGY EXAM  04/01/2020  . TETANUS/TDAP  12/28/2023  . Hepatitis C Screening  Completed  . PNA vac Low Risk Adult  Completed     Discussed health benefits of physical activity, and encouraged him to engage in regular exercise appropriate for his age and condition.    --------------------------------------------------------------------    Lelon Huh, MD  [facility]

## 2019-04-14 NOTE — Patient Instructions (Signed)
.   Please review the attached list of medications and notify my office if there are any errors.   . Please bring all of your medications to every appointment so we can make sure that our medication list is the same as yours.   . It is especially important to get the annual flu vaccine this year. If you haven't had it already, please go to your pharmacy or call the office as soon as possible to schedule you flu shot.  

## 2019-04-14 NOTE — Progress Notes (Signed)
Patient: Mark Fitzpatrick, Male    DOB: [birth year], 68 y.o.   MRN: [id number] Visit Date: 04/14/2019  Today's Provider: Lelon Huh, MD   Chief Complaint  Patient presents with  . Medicare Wellness   Subjective:     Annual wellness visit Mark Fitzpatrick is a 68 y.o. male. He feels fairly well. He reports exercising daily. He reports he is sleeping fairly well.  -----------------------------------------------------------  Diabetes Mellitus Type II, Follow-up:   Lab Results  Component Value Date   HGBA1C 7.7 (A) 12/07/2018   HGBA1C 7.0 (H) 06/09/2018   HGBA1C 6.8 (A) 01/23/2018    Last seen for diabetes 4 months ago.  Management since then includes no changes. He reports good compliance with treatment. He is not having side effects.  Current symptoms include none and have been stable. Home blood sugar records: fasting range: 165-170  Episodes of hypoglycemia? no   Current insulin regiment: Is not on insulin Most Recent Eye Exam: UTD Weight trend: fluctuating a bit Prior visit with dietician: No Current exercise: walking Current diet habits: in general, a "healthy" diet    Pertinent Labs:    Component Value Date/Time   CHOL 131 06/09/2018 1017   TRIG 71 06/09/2018 1017   HDL 68 06/09/2018 1017   LDLCALC 49 06/09/2018 1017   CREATININE 1.04 06/09/2018 1017    Wt Readings from Last 3 Encounters:  04/14/19 216 lb (98 kg)  12/07/18 218 lb (98.9 kg)  06/09/18 214 lb (97.1 kg)    ------------------------------------------------------------------------   Review of Systems  Constitutional: Negative for appetite change, chills, fatigue and fever.  HENT: Negative for congestion, ear pain, hearing loss, nosebleeds and trouble swallowing.   Eyes: Negative for pain and visual disturbance.  Respiratory: Negative for cough, chest tightness and shortness of breath.   Cardiovascular: Negative for chest pain, palpitations and leg swelling.   Gastrointestinal: Negative for abdominal pain, blood in stool, constipation, diarrhea, nausea and vomiting.  Endocrine: Negative for polydipsia, polyphagia and polyuria.  Genitourinary: Negative for dysuria and flank pain.  Musculoskeletal: Negative for arthralgias, back pain, joint swelling, myalgias and neck stiffness.  Skin: Negative for color change, rash and wound.  Neurological: Negative for dizziness, tremors, seizures, speech difficulty, weakness, light-headedness and headaches.  Psychiatric/Behavioral: Negative for behavioral problems, confusion, decreased concentration, dysphoric mood and sleep disturbance. The patient is not nervous/anxious.   All other systems reviewed and are negative.   Social History   Socioeconomic History  . Marital status: Married    Spouse name: Not on file  . Number of children: 0  . Years of education: H/S  . Highest education level: 12th grade  Occupational History  . Occupation: Retired  Scientific laboratory technician  . Financial resource strain: Not hard at all  . Food insecurity    Worry: Never true    Inability: Never true  . Transportation needs    Medical: No    Non-medical: No  Tobacco Use  . Smoking status: Former Smoker    Packs/day: 0.25    Years: 5.00    Pack years: 1.25    Types: Cigarettes    Quit date: 06/30/1984    Years since quitting: 34.8  . Smokeless tobacco: Never Used  . Tobacco comment: started smoking at age 26  Substance and Sexual Activity  . Alcohol use: Not Currently  . Drug use: No  . Sexual activity: Not on file  Lifestyle  . Physical activity    Days per  week: Not on file    Minutes per session: Not on file  . Stress: Not at all  Relationships  . Social Herbalist on phone: Not on file    Gets together: Not on file    Attends religious service: Not on file    Active member of club or organization: Not on file    Attends meetings of clubs or organizations: Not on file    Relationship status: Not on  file  . Intimate partner violence    Fear of current or ex partner: Not on file    Emotionally abused: Not on file    Physically abused: Not on file    Forced sexual activity: Not on file  Other Topics Concern  . Not on file  Social History Narrative  . Not on file    Past Medical History:  Diagnosis Date  . Bradycardia   . Bursitis of shoulder   . Diabetes mellitus without complication ([place])   . Erectile dysfunction   . Heart murmur    as child  . History of broken nose   . Hx of dysplastic nevus 07/13/2012   L lat infrapectoral costal area, moderate  . Shoulder pain      Patient Active Problem List   Diagnosis Date Noted  . Bradycardia, sinus 12/27/2014  . Diabetes mellitus type 2 with retinopathy ([place]) 12/27/2014  . Failure of erection 12/27/2014  . Premature atrial contractions 04/09/2013    Past Surgical History:  Procedure Laterality Date  . collar bone fracture    . COLONOSCOPY    . RECONSTRUCTION OF NOSE  1996    His family history includes Bone cancer in his father; Cancer in his maternal grandfather; Dementia in his mother; Diabetes in his father and mother; Healthy in his brother and brother; Heart attack in his paternal grandfather; Hypertension in his mother; Ovarian cancer in his maternal grandmother.   Current Outpatient Medications:  .  aspirin EC 81 MG tablet, Take 1 tablet (81 mg total) by mouth daily., Disp: 90 tablet, Rfl: 3 .  atorvastatin (LIPITOR) 10 MG tablet, TAKE 1 TABLET BY MOUTH EVERY DAY, Disp: 90 tablet, Rfl: 4 .  CINNAMON PO, Take 1,000 mg by mouth 2 (two) times daily. , Disp: , Rfl:  .  ELDERBERRY PO, Take 1 Dose by mouth daily., Disp: , Rfl:  .  glimepiride (AMARYL) 4 MG tablet, Take 0.5 tablets (2 mg total) by mouth daily., Disp: 30 tablet, Rfl: 5 .  JANUVIA 100 MG tablet, TAKE 1 TABLET BY MOUTH EVERY DAY, Disp: 90 tablet, Rfl: 3 .  metFORMIN (GLUCOPHAGE) 1000 MG tablet, TAKE 1 TABLET BY MOUTH TWICE A DAY, Disp: 180 tablet, Rfl: 3  .  pioglitazone (ACTOS) 45 MG tablet, Take 1 tablet (45 mg total) by mouth daily., Disp: 90 tablet, Rfl: 0 .  sildenafil (VIAGRA) 50 MG tablet, Take 50 mg by mouth daily as needed for erectile dysfunction., Disp: , Rfl:   Patient Care Team: Birdie Sons, MD as PCP - General (Family Medicine) Birder Robson, MD as Referring Physician (Ophthalmology)    Objective:    Vitals: BP 120/60 (BP Location: Left Arm, Patient Position: Sitting, Cuff Size: Large)   Pulse 63   Temp (!) 96.6 F (35.9 C) (Temporal)   Resp 16   Wt 216 lb (98 kg)   SpO2 99% Comment: room air  BMI 30.13 kg/m   Physical Exam  Activities of Daily Living In your present state  of health, do you have any difficulty performing the following activities: 04/14/2019  Hearing? Y  Vision? N  Difficulty concentrating or making decisions? N  Walking or climbing stairs? N  Dressing or bathing? N  Doing errands, shopping? N  Some recent data might be hidden    Fall Risk Assessment Fall Risk  04/14/2019 12/25/2017 10/21/2016 03/20/2016  Falls in the past year? 0 No No No  Number falls in past yr: 0 - - -  Injury with Fall? 0 - - -  Follow up Falls evaluation completed - - -     Depression Screen PHQ 2/9 Scores 04/14/2019 12/25/2017 12/25/2017 10/21/2016  PHQ - 2 Score 0 0 0 0  PHQ- 9 Score - 0 - 0   Cognitive Testing - 6-CIT  Correct? Score   What year is it? yes 0 0 or 4  What month is it? yes 0 0 or 3  Memorize:    Pia Mau,  41,  [place],      What time is it? (within 1 hour) yes 0 0 or 3  Count backwards from 20 yes 0 0, 2, or 4  Name the months of the year yes 0 0, 2, or 4  Repeat name & address above yes 0 0, 2, 4, 6, 8, or 10       TOTAL SCORE  0/28   Interpretation:  Normal  Normal (0-7) Abnormal (8-28)    No flowsheet data found.    Assessment & Plan:     Annual Wellness Visit  Reviewed patient's Family Medical History Reviewed and updated list of patient's medical  providers Assessment of cognitive impairment was done Assessed patient's functional ability Established a written schedule for health screening [place] Completed and Reviewed  Exercise Activities and Dietary recommendations Goals    . DIET - INCREASE WATER INTAKE     Recommend increasing water intake to 4-6 glasses a day.        Immunization History  Administered Date(s) Administered  . Influenza Split 03/12/2012  . Influenza, High Dose Seasonal PF 04/21/2015, 03/20/2016, 05/15/2017, 05/13/2018  . Influenza,inj,Quad PF,6+ Mos 04/14/2013, 04/25/2014  . Pneumococcal Conjugate-13 04/21/2015  . Pneumococcal Polysaccharide-23 05/23/2010, 10/21/2016  . Tdap 12/25/2006, 12/27/2013  . Zoster 12/27/2013  . Zoster Recombinat (Shingrix) 02/05/2018, 08/04/2018    Health Maintenance  Topic Date Due  . FOOT EXAM  10/21/2017  . INFLUENZA VACCINE  01/30/2019  . HEMOGLOBIN A1C  06/08/2019  . URINE MICROALBUMIN  12/07/2019  . Fecal DNA (Cologuard)  12/10/2019  . OPHTHALMOLOGY EXAM  04/01/2020  . TETANUS/TDAP  12/28/2023  . Hepatitis C Screening  Completed  . PNA vac Low Risk Adult  Completed     Discussed health benefits of physical activity, and encouraged him to engage in regular exercise appropriate for his age and condition.    ------------------------------------------------------------------------------------------------------------  1. Prostate cancer screening  - PSA  2. Type 2 diabetes mellitus with retinopathy, without long-term current use of insulin, macular edema presence unspecified, unspecified laterality, unspecified retinopathy severity ([place]) Well controlled.  Continue current medications.    3. Need for influenza vaccination  - Flu Vaccine QUAD High Dose(Fluad) - Comprehensive metabolic panel - Lipid panel - Hemoglobin A1c  The entirety of the information documented in the History of Present Illness, Review of Systems and Physical Exam  were personally obtained by me. Portions of this information were initially documented by Meyer Cory, CMA and reviewed by me for thoroughness  and accuracy.    Lelon Huh, MD  [facility]

## 2019-04-15 LAB — COMPREHENSIVE METABOLIC PANEL
ALT: 17 IU/L (ref 0–44)
AST: 18 IU/L (ref 0–40)
Albumin/Globulin Ratio: 2.5 — ABNORMAL HIGH (ref 1.2–2.2)
Albumin: 4.7 g/dL (ref 3.8–4.8)
Alkaline Phosphatase: 53 IU/L (ref 39–117)
BUN/Creatinine Ratio: 12 (ref 10–24)
BUN: 15 mg/dL (ref 8–27)
Bilirubin Total: 0.5 mg/dL (ref 0.0–1.2)
CO2: 23 mmol/L (ref 20–29)
Calcium: 9.3 mg/dL (ref 8.6–10.2)
Chloride: 99 mmol/L (ref 96–106)
Creatinine, Ser: 1.24 mg/dL (ref 0.76–1.27)
GFR calc Af Amer: 68 mL/min/{1.73_m2} (ref >59)
GFR calc non Af Amer: 59 mL/min/{1.73_m2} — ABNORMAL LOW (ref >59)
Globulin, Total: 1.9 g/dL (ref 1.5–4.5)
Glucose: 186 mg/dL — ABNORMAL HIGH (ref 65–99)
Potassium: 4.8 mmol/L (ref 3.5–5.2)
Sodium: 140 mmol/L (ref 134–144)
Total Protein: 6.6 g/dL (ref 6.0–8.5)

## 2019-04-15 LAB — HEMOGLOBIN A1C
Est. average glucose Bld gHb Est-mCnc: 157 mg/dL
Hgb A1c MFr Bld: 7.1 % — ABNORMAL HIGH (ref 4.8–5.6)

## 2019-04-15 LAB — LIPID PANEL
Chol/HDL Ratio: 2 ratio (ref 0.0–5.0)
Cholesterol, Total: 131 mg/dL (ref 100–199)
HDL: 67 mg/dL (ref >39)
LDL Chol Calc (NIH): 51 mg/dL (ref 0–99)
Triglycerides: 59 mg/dL (ref 0–149)
VLDL Cholesterol Cal: 13 mg/dL (ref 5–40)

## 2019-04-15 LAB — PSA: Prostate Specific Ag, Serum: 1.1 ng/mL (ref 0.0–4.0)

## 2019-04-23 ENCOUNTER — Emergency Department
Admission: EM | Admit: 2019-04-23 | Discharge: 2019-04-23 | Disposition: A | Discharge status: Home / Self Care | Payer: Medicare Other | Attending: Emergency Medicine | Admitting: Emergency Medicine

## 2019-04-23 ENCOUNTER — Other Ambulatory Visit: Payer: Self-pay

## 2019-04-23 ENCOUNTER — Emergency Department: Payer: Medicare Other

## 2019-04-23 DIAGNOSIS — Z7984 Long term (current) use of oral hypoglycemic drugs: Secondary | ICD-10-CM | POA: Diagnosis not present

## 2019-04-23 DIAGNOSIS — Z87891 Personal history of nicotine dependence: Secondary | ICD-10-CM | POA: Diagnosis not present

## 2019-04-23 DIAGNOSIS — Z7982 Long term (current) use of aspirin: Secondary | ICD-10-CM | POA: Diagnosis not present

## 2019-04-23 DIAGNOSIS — Z79899 Other long term (current) drug therapy: Secondary | ICD-10-CM | POA: Diagnosis not present

## 2019-04-23 DIAGNOSIS — R112 Nausea with vomiting, unspecified: Secondary | ICD-10-CM | POA: Insufficient documentation

## 2019-04-23 DIAGNOSIS — E119 Type 2 diabetes mellitus without complications: Secondary | ICD-10-CM | POA: Insufficient documentation

## 2019-04-23 DIAGNOSIS — Z20828 Contact with and (suspected) exposure to other viral communicable diseases: Secondary | ICD-10-CM | POA: Insufficient documentation

## 2019-04-23 LAB — COMPREHENSIVE METABOLIC PANEL
ALT: 19 U/L (ref 0–44)
AST: 21 U/L (ref 15–41)
Albumin: 4 g/dL (ref 3.5–5.0)
Alkaline Phosphatase: 56 U/L (ref 38–126)
Anion gap: 13 (ref 5–15)
BUN: 19 mg/dL (ref 8–23)
CO2: 22 mmol/L (ref 22–32)
Calcium: 9.2 mg/dL (ref 8.9–10.3)
Chloride: 104 mmol/L (ref 98–111)
Creatinine, Ser: 1.33 mg/dL — ABNORMAL HIGH (ref 0.61–1.24)
GFR calc Af Amer: >60 mL/min (ref >60)
GFR calc non Af Amer: 54 mL/min — ABNORMAL LOW (ref >60)
Glucose, Bld: 297 mg/dL — ABNORMAL HIGH (ref 70–99)
Potassium: 3.7 mmol/L (ref 3.5–5.1)
Sodium: 139 mmol/L (ref 135–145)
Total Bilirubin: 2.2 mg/dL — ABNORMAL HIGH (ref 0.3–1.2)
Total Protein: 7.5 g/dL (ref 6.5–8.1)

## 2019-04-23 LAB — SARS CORONAVIRUS 2 (TAT 6-24 HRS): SARS Coronavirus 2: NEGATIVE

## 2019-04-23 LAB — CBC WITH DIFFERENTIAL/PLATELET
Abs Immature Granulocytes: 0.17 10*3/uL — ABNORMAL HIGH (ref 0.00–0.07)
Basophils Absolute: 0 10*3/uL (ref 0.0–0.1)
Basophils Relative: 0 %
Eosinophils Absolute: 0 10*3/uL (ref 0.0–0.5)
Eosinophils Relative: 0 %
HCT: 41.4 % (ref 39.0–52.0)
Hemoglobin: 13.4 g/dL (ref 13.0–17.0)
Immature Granulocytes: 1 %
Lymphocytes Relative: 6 %
Lymphs Abs: 0.8 10*3/uL (ref 0.7–4.0)
MCH: 29.2 pg (ref 26.0–34.0)
MCHC: 32.4 g/dL (ref 30.0–36.0)
MCV: 90.2 fL (ref 80.0–100.0)
Monocytes Absolute: 0.6 10*3/uL (ref 0.1–1.0)
Monocytes Relative: 4 %
Neutro Abs: 12.5 10*3/uL — ABNORMAL HIGH (ref 1.7–7.7)
Neutrophils Relative %: 89 %
Platelets: 182 10*3/uL (ref 150–400)
RBC: 4.59 MIL/uL (ref 4.22–5.81)
RDW: 13.2 % (ref 11.5–15.5)
WBC: 14.1 10*3/uL — ABNORMAL HIGH (ref 4.0–10.5)
nRBC: 0 % (ref 0.0–0.2)

## 2019-04-23 LAB — LIPASE, BLOOD: Lipase: 25 U/L (ref 11–51)

## 2019-04-23 MED ORDER — SODIUM CHLORIDE 0.9 % IV SOLN
1000.0000 mL | Freq: Once | INTRAVENOUS | Status: AC
  Administered 2019-04-23: 09:00:00 1000 mL via INTRAVENOUS

## 2019-04-23 NOTE — ED Notes (Signed)
This RN to bedside, upon arrival to bedside pt noted to be resting in bed comfortably with eyes closed, respirations even and unlabored. Pt awakens easily with this RN arrival to bedside. Pt given more water, pt finished fluids at this time. Pt denies needing to urinate at this time. Pt denies any needs at this time. Pt once again encouraged to urinate. Will continue to monitor for further patient needs.

## 2019-04-23 NOTE — ED Notes (Signed)
Spoke with pt wife over phone per pt request. Clarified pt to drink fluids as tolerable at home and covid results will arrive within 24 hours, call received if positive result. Pt and wife confirmed understanding of discharge instructions. No further questions at this time.

## 2019-04-23 NOTE — ED Notes (Signed)
Pt provided bedside urinal and expressed understanding of providing UA. Will continue to monitor.

## 2019-04-23 NOTE — ED Notes (Signed)
This RN to bedside at this time. Pt continues to rest in bed comfortably. Pt states he still does not feel like he needs to urinate at this time. Pt encouraged to try to give urine sample. Will continue to monitor for further patient needs. VSS.

## 2019-04-23 NOTE — ED Notes (Signed)
Pt given blanket and water with MD permission. Pt encouraged to provide urine sample at this time.
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
## 2019-04-23 NOTE — ED Provider Notes (Signed)
[facility] Emergency Department Provider Note   ____________________________________________    I have reviewed the triage vital signs and the nursing notes.   HISTORY  Chief Complaint Nausea     HPI Mark Fitzpatrick is a 68 y.o. male with a history of diabetes who presents with complaints of nausea vomiting and chills.  Patient reports he felt overall well yesterday, did describe a mild headache.  Woke up this morning with significant chills, diaphoresis.  Shortly thereafter he began to feel nauseated and vomited several times.  No diarrhea.  No abdominal pain.  He states his nausea is much better now.  No chest pain.  No shortness of breath or cough.  Has been careful to avoid coronavirus exposure by wearing a mask.  No sick contacts reported.  No recent travel.  Does not take anything for this.  Currently is feeling better  Past Medical History:  Diagnosis Date  . Bradycardia   . Bursitis of shoulder   . Diabetes mellitus without complication ([place])   . Erectile dysfunction   . Heart murmur    as child  . History of broken nose   . Hx of dysplastic nevus 07/13/2012   L lat infrapectoral costal area, moderate  . Shoulder pain     Patient Active Problem List   Diagnosis Date Noted  . Bradycardia, sinus 12/27/2014  . Diabetes mellitus type 2 with retinopathy ([address]) 12/27/2014  . Failure of erection 12/27/2014  . Premature atrial contractions 04/09/2013    Past Surgical History:  Procedure Laterality Date  . collar bone fracture    . COLONOSCOPY    . RECONSTRUCTION OF NOSE  1996    Prior to Admission medications   Medication Sig Start Date End Date Taking? Authorizing Provider  aspirin EC 81 MG tablet Take 1 tablet (81 mg total) by mouth daily. 04/09/13   Wellington Hampshire, MD  atorvastatin (LIPITOR) 10 MG tablet TAKE 1 TABLET BY MOUTH EVERY DAY 05/03/18   Birdie Sons, MD  CINNAMON PO Take 1,000 mg by mouth 2 (two) times daily.      [provider]  ELDERBERRY PO Take 1 Dose by mouth daily.    [provider]  glimepiride (AMARYL) 4 MG tablet Take 0.5 tablets (2 mg total) by mouth daily. 10/18/18   Birdie Sons, MD  JANUVIA 100 MG tablet TAKE 1 TABLET BY MOUTH EVERY DAY 02/03/19   Birdie Sons, MD  metFORMIN (GLUCOPHAGE) 1000 MG tablet TAKE 1 TABLET BY MOUTH TWICE A DAY 02/03/19   Birdie Sons, MD  pioglitazone (ACTOS) 45 MG tablet Take 1 tablet (45 mg total) by mouth daily. 03/18/19   Birdie Sons, MD  sildenafil (VIAGRA) 50 MG tablet Take 50 mg by mouth daily as needed for erectile dysfunction.    [provider]     Allergies Patient has no known allergies.  Family History  Problem Relation Age of Onset  . Diabetes Mother   . Dementia Mother   . Hypertension Mother   . Bone cancer Father        spread to liver  . Diabetes Father   . Heart attack Paternal Grandfather   . Healthy Brother   . Ovarian cancer Maternal Grandmother   . Cancer Maternal Grandfather        unknown  . Healthy Brother     Social History Social History   Tobacco Use  . Smoking status: Former Smoker  Packs/day: 0.25    Years: 5.00    Pack years: 1.25    Types: Cigarettes    Quit date: 06/30/1984    Years since quitting: 34.8  . Smokeless tobacco: Never Used  . Tobacco comment: started smoking at age 5  Substance Use Topics  . Alcohol use: Not Currently  . Drug use: No    Review of Systems  Constitutional: Positive chills Eyes: No visual changes.  ENT: No sore throat. Cardiovascular: Denies chest pain. Respiratory: Denies shortness of breath.  No cough Gastrointestinal: As above Genitourinary: Negative for dysuria. Musculoskeletal: Negative for back pain. Skin: No rash Neurological: Headache yesterday, now improved   ____________________________________________   PHYSICAL EXAM:  VITAL SIGNS: ED Triage Vitals  Enc Vitals Group     BP 04/23/19 0905 (!) 111/58      Pulse Rate 04/23/19 0905 82     Resp 04/23/19 0905 (!) 25     Temp 04/23/19 0905 100.2 F (37.9 C)     Temp Source 04/23/19 0905 Oral     SpO2 04/23/19 0905 95 %     Weight 04/23/19 0906 98 kg (216 lb)     Height 04/23/19 0906 1.803 m (5\' 11" )     Head Circumference --      Peak Flow --      Pain Score 04/23/19 0906 0     Pain Loc --      Pain Edu? --      Excl. in [place]? --     Constitutional: Alert and oriented.  Nose: No congestion/rhinnorhea. Mouth/Throat: Mucous membranes are moist.    Cardiovascular: Normal rate, regular rhythm. Grossly normal heart sounds.  Good peripheral circulation. Respiratory: Normal respiratory effort.  No retractions. Lungs CTAB. Gastrointestinal: Soft and nontender. No distention.  No CVA tenderness.  Reassuring exam Genitourinary: deferred Musculoskeletal: No lower extremity tenderness nor edema.  Warm and well perfused Neurologic:  Normal speech and language. No gross focal neurologic deficits are appreciated.  Skin:  Skin is warm, sure it is damp Psychiatric: Mood and affect are normal. Speech and behavior are normal.  ____________________________________________   LABS (all labs ordered are listed, but only abnormal results are displayed)  Labs Reviewed  CBC WITH DIFFERENTIAL/PLATELET - Abnormal; Notable for the following components:      Result Value   WBC 14.1 (*)    Neutro Abs 12.5 (*)    Abs Immature Granulocytes 0.17 (*)    All other components within normal limits  COMPREHENSIVE METABOLIC PANEL - Abnormal; Notable for the following components:   Glucose, Bld 297 (*)    Creatinine, Ser 1.33 (*)    Total Bilirubin 2.2 (*)    GFR calc non Af Amer 54 (*)    All other components within normal limits  SARS CORONAVIRUS 2 (TAT 6-24 HRS)  LIPASE, BLOOD  URINALYSIS, COMPLETE (UACMP) WITH MICROSCOPIC   ____________________________________________  EKG  ED ECG REPORT I, Lavonia Drafts, the attending physician, personally viewed and  interpreted this ECG.  Date: 04/23/2019  Rhythm: normal sinus rhythm QRS Axis: normal Intervals: normal ST/T Wave abnormalities: normal Narrative Interpretation: Atrial premature complexes  ____________________________________________  RADIOLOGY  Chest x-ray unremarkable ____________________________________________   PROCEDURES  Procedure(s) performed: No  Procedures   Critical Care performed: No ____________________________________________   INITIAL IMPRESSION / ASSESSMENT AND PLAN / ED COURSE  Pertinent labs & imaging results that were available during my care of the patient were reviewed by me and considered in my medical decision making (see chart  for details).  Patient presents with chills, episode of nausea and vomiting, no abdominal pain.  No cough or shortness of breath.  Given global pandemic suspicious for coronavirus.  We will give IV fluids, IV Zofran, check labs, send Covid swab, chest x-ray and monitor.  He reports he is feeling better here in the emergency department   ----------------------------------------- 1:56 PM on 04/23/2019 -----------------------------------------  On reevaluation patient is feeling well, no nausea vomiting abdominal pain.  No chest pain no shortness of breath no cough.  He feels that he is ready to go home.  Temperature is 98.2, vitals reassuring.  Lab work is overall reassuring, mild elevation white blood cell count is nonspecific.  Chest x-ray is unremarkable.  Patient has been observed in the emergency department for several hours now and feels very well.  Strongly doubt sepsis.  Coronavirus is a possibility, test pending.  Offered admission however patient would like to go home I think this is reasonable as he agrees to return if any worsening of his symptoms.   ENRRIQUE PURI was evaluated in Emergency Department on 04/23/2019 for the symptoms described in the history of present illness. He was evaluated in the context of  the global COVID-19 pandemic, which necessitated consideration that the patient might be at risk for infection with the SARS-CoV-2 virus that causes COVID-19. Institutional protocols and algorithms that pertain to the evaluation of patients at risk for COVID-19 are in a state of rapid change based on information released by regulatory bodies including the CDC and federal and state organizations. These policies and algorithms were followed during the patient's care in the ED.    ____________________________________________   FINAL CLINICAL IMPRESSION(S) / ED DIAGNOSES  Final diagnoses:  Nausea and vomiting, intractability of vomiting not specified, unspecified vomiting type        Note:  This document was prepared using Dragon voice recognition software and may include unintentional dictation errors.   Lavonia Drafts, MD 04/23/19 1357

## 2019-04-23 NOTE — ED Notes (Signed)
This RN to bedside at this time, pt continues to rest in bed with NAD noted, repeat temp obtained by this RN. Pt denies any needs, pt received approx 900cc's fluid at this time. Pt has been able to tolerate PO fluids without difficulty. Pt denies feeling the need to urinate at this time. This RN once again encouraged patient to provide urine sample when able. MD aware that patient still unable to urinate at this time.

## 2019-06-18 ENCOUNTER — Other Ambulatory Visit: Payer: Self-pay | Admitting: Family Medicine

## 2019-06-18 DIAGNOSIS — E119 Type 2 diabetes mellitus without complications: Secondary | ICD-10-CM

## 2019-07-09 ENCOUNTER — Other Ambulatory Visit: Payer: Self-pay | Admitting: Family Medicine

## 2019-08-09 ENCOUNTER — Ambulatory Visit: Payer: Medicare PPO | Attending: Internal Medicine

## 2019-08-09 DIAGNOSIS — Z23 Encounter for immunization: Secondary | ICD-10-CM | POA: Insufficient documentation

## 2019-08-09 NOTE — Progress Notes (Signed)
   Covid-19 Vaccination Clinic  Name:  Mark Fitzpatrick    MRN: [id number] DOB: Jul 20, 1950  08/09/2019  [name] was observed post Covid-19 immunization for 15 minutes without incidence. He was provided with Vaccine Information Sheet and instruction to access the V-Safe system.   [name] was instructed to call 911 with any severe reactions post vaccine: [name] Kitchen Difficulty breathing  . Swelling of your face and throat  . A fast heartbeat  . A bad rash all over your body  . Dizziness and weakness    Immunizations Administered    Name Date Dose VIS Date Route   Pfizer COVID-19 Vaccine 08/09/2019  3:07 PM 0.3 mL 06/11/2019 Intramuscular   Manufacturer: [place]   Lot: [id number]   [place]: [id number]

## 2019-09-03 ENCOUNTER — Ambulatory Visit: Payer: Medicare PPO | Attending: Internal Medicine

## 2019-09-03 DIAGNOSIS — Z23 Encounter for immunization: Secondary | ICD-10-CM | POA: Insufficient documentation

## 2019-09-03 NOTE — Progress Notes (Signed)
   Covid-19 Vaccination Clinic  Name:  KEEN BOSO    MRN: [id number] DOB: [birth year]  09/03/2019  Mr. Law was observed post Covid-19 immunization for 15 minutes without incident. He was provided with Vaccine Information Sheet and instruction to access the V-Safe system.   Mr. Sahagian was instructed to call 911 with any severe reactions post vaccine: [name] Kitchen Difficulty breathing  . Swelling of face and throat  . A fast heartbeat  . A bad rash all over body  . Dizziness and weakness   Immunizations Administered    Name Date Dose VIS Date Route   Pfizer COVID-19 Vaccine 09/03/2019 10:37 AM 0.3 mL 06/11/2019 Intramuscular   Manufacturer: [place]   Lot: [id number]   [place]: [id number]

## 2019-09-09 ENCOUNTER — Other Ambulatory Visit: Payer: Self-pay | Admitting: Family Medicine

## 2019-09-09 DIAGNOSIS — E119 Type 2 diabetes mellitus without complications: Secondary | ICD-10-CM

## 2019-09-17 ENCOUNTER — Other Ambulatory Visit: Payer: Self-pay | Admitting: Family Medicine

## 2019-09-17 DIAGNOSIS — E119 Type 2 diabetes mellitus without complications: Secondary | ICD-10-CM

## 2019-10-07 ENCOUNTER — Other Ambulatory Visit: Payer: Self-pay | Admitting: Family Medicine

## 2019-10-07 DIAGNOSIS — E119 Type 2 diabetes mellitus without complications: Secondary | ICD-10-CM

## 2019-10-07 NOTE — Telephone Encounter (Signed)
Requested medication (s) are due for refill today: Yes  Requested medication (s) are on the active medication list: Yes  Last refill:  10/18/18  Future visit scheduled: Yes  Notes to clinic:  Pharmacy asking for diagnosis code.    Requested Prescriptions  Pending Prescriptions Disp Refills   glimepiride (AMARYL) 4 MG tablet [Pharmacy Med Name: GLIMEPIRIDE 4 MG TABLET] 90 tablet 1    Sig: TAKE ONE-HALF TABLET (2 MG TOTAL) BY MOUTH DAILY.      Endocrinology:  Diabetes - Sulfonylureas Passed - 10/07/2019 11:39 AM      Passed - HBA1C is between 0 and 7.9 and within 180 days    Hgb A1c MFr Bld  Date Value Ref Range Status  04/14/2019 7.1 (H) 4.8 - 5.6 % Final    Comment:             Prediabetes: 5.7 - 6.4          Diabetes: >6.4          Glycemic control for adults with diabetes: <7.0           Passed - Valid encounter within last 6 months    Recent Outpatient Visits           5 months ago Need for influenza vaccination   [facility] Birdie Sons, MD   10 months ago Controlled type 2 diabetes mellitus with mild nonproliferative retinopathy without macular edema, without long-term current use of insulin, unspecified laterality [facility])   [facility] Birdie Sons, MD   1 year ago Controlled type 2 diabetes mellitus with mild nonproliferative retinopathy without macular edema, without long-term current use of insulin, unspecified laterality [facility])   [facility] Birdie Sons, MD   1 year ago Annual physical exam   [facility] Birdie Sons, MD   2 years ago Controlled type 2 diabetes mellitus without complication, without long-term current use of insulin [facility])   [facility] Birdie Sons, MD       Future Appointments             In 5 days Fisher, Kirstie Peri, MD [facility], [place]

## 2019-10-09 ENCOUNTER — Other Ambulatory Visit: Payer: Self-pay | Admitting: Family Medicine

## 2019-10-09 DIAGNOSIS — E119 Type 2 diabetes mellitus without complications: Secondary | ICD-10-CM

## 2019-10-09 NOTE — Telephone Encounter (Signed)
Requested Prescriptions  Pending Prescriptions Disp Refills  . pioglitazone (ACTOS) 45 MG tablet [Pharmacy Med Name: PIOGLITAZONE HCL 45 MG TABLET] 30 tablet 0    Sig: TAKE 1 TABLET BY MOUTH EVERY DAY     Endocrinology:  Diabetes - Glitazones - pioglitazone Passed - 10/09/2019  2:35 PM      Passed - HBA1C is between 0 and 7.9 and within 180 days    Hgb A1c MFr Bld  Date Value Ref Range Status  04/14/2019 7.1 (H) 4.8 - 5.6 % Final    Comment:             Prediabetes: 5.7 - 6.4          Diabetes: >6.4          Glycemic control for adults with diabetes: <7.0          Passed - Valid encounter within last 6 months    Recent Outpatient Visits          5 months ago Need for influenza vaccination   [facility] Birdie Sons, MD   10 months ago Controlled type 2 diabetes mellitus with mild nonproliferative retinopathy without macular edema, without long-term current use of insulin, unspecified laterality [facility])   [facility] Birdie Sons, MD   1 year ago Controlled type 2 diabetes mellitus with mild nonproliferative retinopathy without macular edema, without long-term current use of insulin, unspecified laterality [facility])   [facility] Birdie Sons, MD   1 year ago Annual physical exam   [facility] Birdie Sons, MD   2 years ago Controlled type 2 diabetes mellitus without complication, without long-term current use of insulin [facility])   [facility] Birdie Sons, MD      Future Appointments            In 3 days Fisher, Kirstie Peri, MD [facility], [place]

## 2019-10-11 NOTE — Progress Notes (Signed)
Established patient visit     Patient: Mark Fitzpatrick   DOB: 03-12-1951   69 y.o. Male  MRN: [id number] Visit Date: 10/12/2019  Today's healthcare provider: Lelon Huh, MD  Subjective:    Mertie Moores as a scribe for Lelon Huh, MD.,have documented all relevant documentation on the behalf of Lelon Huh, MD,as directed by  Lelon Huh, MD while in the presence of Lelon Huh, MD  Chief Complaint  Patient presents with  . Diabetes  . Hyperlipidemia   HPI Diabetes Mellitus Type II, Follow-up  Lab Results  Component Value Date   HGBA1C 7.1 (H) 04/14/2019   HGBA1C 7.7 (A) 12/07/2018   HGBA1C 7.0 (H) 06/09/2018   Last seen for diabetes 6 months ago.  Management since then includes no change. He reports good compliance with treatment. He is not having side effects.   Home blood sugar records: fasting range: fluctuating   Episodes of hypoglycemia? Yes- 1 or 2 times    Current insulin regiment: None Most Recent Eye Exam: 04/02/2019  ----------------------------------------------------------------------------------------------------------------------  Last Lipid Panel:    Component Value Date/Time   CHOL 131 04/14/2019 0951   TRIG 59 04/14/2019 0951   HDL 67 04/14/2019 0951   CHOLHDL 2.0 04/14/2019 0951   LDLCALC 51 04/14/2019 0951    He was last seen for this 6 months ago.  Management since that visit includes no change.  He reports good compliance with treatment. He is not having side effects.  Symptoms: No appetite changes No foot ulcerations No chest pain No chest pressure/discomfort No dyspnea No fatigue No lower extremity edema No nausea No numbness or tingling of extremity No orthopnea No palpitations No paroxysmal nocturnal dyspnea No polydipsia No polyuria No speech difficulty No syncope No visual disturbances   He is following a Regular diet. Current exercise: walking  Wt Readings from Last 3 Encounters:   10/12/19 215 lb 6.4 oz (97.7 kg)  04/23/19 216 lb (98 kg)  04/14/19 216 lb (98 kg)   Last metabolic panel Lab Results  Component Value Date   GLUCOSE 297 (H) 04/23/2019   NA 139 04/23/2019   K 3.7 04/23/2019   CL 104 04/23/2019   CO2 22 04/23/2019   BUN 19 04/23/2019   CREATININE 1.33 (H) 04/23/2019   GFRNONAA 54 (L) 04/23/2019   GFRAA >60 04/23/2019   CALCIUM 9.2 04/23/2019   PROT 7.5 04/23/2019   ALBUMIN 4.0 04/23/2019   LABGLOB 1.9 04/14/2019   AGRATIO 2.5 (H) 04/14/2019   BILITOT 2.2 (H) 04/23/2019   ALKPHOS 56 04/23/2019   AST 21 04/23/2019   ALT 19 04/23/2019   ANIONGAP 13 04/23/2019   The 10-year ASCVD risk score Mikey Bussing DC Jr., et al., 2013) is: 25%  -------------------------------------------------------------------------------------------------------------      Medications: Outpatient Medications Prior to Visit  Medication Sig  . aspirin EC 81 MG tablet Take 1 tablet (81 mg total) by mouth daily.  [name] Kitchen atorvastatin (LIPITOR) 10 MG tablet TAKE 1 TABLET BY MOUTH EVERY DAY  . CINNAMON PO Take 1,000 mg by mouth 2 (two) times daily.   [name] Kitchen ELDERBERRY PO Take 1 Dose by mouth daily.  [name] Kitchen glimepiride (AMARYL) 4 MG tablet TAKE ONE-HALF TABLET (2 MG TOTAL) BY MOUTH DAILY.  [name] Kitchen JANUVIA 100 MG tablet TAKE 1 TABLET BY MOUTH EVERY DAY  . metFORMIN (GLUCOPHAGE) 1000 MG tablet TAKE 1 TABLET BY MOUTH TWICE A DAY  . pioglitazone (ACTOS) 45 MG tablet TAKE 1 TABLET BY MOUTH EVERY DAY  . sildenafil (  VIAGRA) 50 MG tablet Take 50 mg by mouth daily as needed for erectile dysfunction.   No facility-administered medications prior to visit.    Review of Systems  Constitutional: Negative.   Respiratory: Negative.   Cardiovascular: Negative.   Endocrine: Negative.   Musculoskeletal:       Left heel pain         Objective:    BP (!) 141/64 (BP Location: Right Arm, Patient Position: Sitting, Cuff Size: Large)   Pulse (!) 54   Temp (!) 96.8 F (36 C) (Temporal)   Wt 215 lb 6.4 oz  (97.7 kg)   BMI 30.04 kg/m     Physical Exam   General: Appearance:    Overweight male in no acute distress  Eyes:    PERRL, conjunctiva/corneas clear, EOM's intact       Lungs:     Clear to auscultation bilaterally, respirations unlabored  Heart:    Bradycardic. Normal rhythm. No murmurs, rubs, or gallops.   MS:   All extremities are intact.   Neurologic:   Awake, alert, oriented x 3. No apparent focal neurological           defect.       No results found for any visits on 10/12/19.    Assessment & Plan:    1. Type 2 diabetes mellitus with retinopathy, without long-term current use of insulin, macular edema presence unspecified, unspecified laterality, unspecified retinopathy severity (HCC)  - Lipid Profile - CBC with Differential - Comprehensive Metabolic Panel (CMET) - HgB A1c - pioglitazone (ACTOS) 45 MG tablet; Take 1 tablet (45 mg total) by mouth daily.  Dispense: 90 tablet; Refill: [place], MD  [facility] [phone] (phone) [phone] (fax)  [place]

## 2019-10-12 ENCOUNTER — Other Ambulatory Visit: Payer: Self-pay

## 2019-10-12 ENCOUNTER — Ambulatory Visit: Payer: Medicare PPO | Admitting: Dermatology

## 2019-10-12 ENCOUNTER — Encounter: Payer: Self-pay | Admitting: Family Medicine

## 2019-10-12 ENCOUNTER — Ambulatory Visit: Payer: Medicare PPO | Admitting: Family Medicine

## 2019-10-12 VITALS — BP 141/64 | HR 54 | Temp 96.8°F | Wt 215.4 lb

## 2019-10-12 DIAGNOSIS — E11319 Type 2 diabetes mellitus with unspecified diabetic retinopathy without macular edema: Secondary | ICD-10-CM

## 2019-10-12 MED ORDER — PIOGLITAZONE HCL 45 MG PO TABS: 45.0000 mg | ORAL_TABLET | Freq: Every day | ORAL | 1 refills | Status: DC

## 2019-10-13 LAB — CBC WITH DIFFERENTIAL/PLATELET
Basophils Absolute: 0 10*3/uL (ref 0.0–0.2)
Basos: 1 %
EOS (ABSOLUTE): 0.1 10*3/uL (ref 0.0–0.4)
Eos: 2 %
Hematocrit: 41.6 % (ref 37.5–51.0)
Hemoglobin: 13.7 g/dL (ref 13.0–17.7)
Immature Grans (Abs): 0 10*3/uL (ref 0.0–0.1)
Immature Granulocytes: 1 %
Lymphocytes Absolute: 1.4 10*3/uL (ref 0.7–3.1)
Lymphs: 24 %
MCH: 29.5 pg (ref 26.6–33.0)
MCHC: 32.9 g/dL (ref 31.5–35.7)
MCV: 90 fL (ref 79–97)
Monocytes Absolute: 0.5 10*3/uL (ref 0.1–0.9)
Monocytes: 8 %
Neutrophils Absolute: 3.9 10*3/uL (ref 1.4–7.0)
Neutrophils: 64 %
Platelets: 235 10*3/uL (ref 150–450)
RBC: 4.64 x10E6/uL (ref 4.14–5.80)
RDW: 12.3 % (ref 11.6–15.4)
WBC: 6 10*3/uL (ref 3.4–10.8)

## 2019-10-13 LAB — COMPREHENSIVE METABOLIC PANEL
ALT: 14 IU/L (ref 0–44)
AST: 18 IU/L (ref 0–40)
Albumin/Globulin Ratio: 2.3 — ABNORMAL HIGH (ref 1.2–2.2)
Albumin: 4.5 g/dL (ref 3.8–4.8)
Alkaline Phosphatase: 53 IU/L (ref 39–117)
BUN/Creatinine Ratio: 12 (ref 10–24)
BUN: 14 mg/dL (ref 8–27)
Bilirubin Total: 0.5 mg/dL (ref 0.0–1.2)
CO2: 21 mmol/L (ref 20–29)
Calcium: 9.5 mg/dL (ref 8.6–10.2)
Chloride: 104 mmol/L (ref 96–106)
Creatinine, Ser: 1.16 mg/dL (ref 0.76–1.27)
GFR calc Af Amer: 74 mL/min/{1.73_m2} (ref >59)
GFR calc non Af Amer: 64 mL/min/{1.73_m2} (ref >59)
Globulin, Total: 2 g/dL (ref 1.5–4.5)
Glucose: 226 mg/dL — ABNORMAL HIGH (ref 65–99)
Potassium: 4.8 mmol/L (ref 3.5–5.2)
Sodium: 139 mmol/L (ref 134–144)
Total Protein: 6.5 g/dL (ref 6.0–8.5)

## 2019-10-13 LAB — HEMOGLOBIN A1C
Est. average glucose Bld gHb Est-mCnc: 177 mg/dL
Hgb A1c MFr Bld: 7.8 % — ABNORMAL HIGH (ref 4.8–5.6)

## 2019-10-13 LAB — LIPID PANEL
Chol/HDL Ratio: 2.1 ratio (ref 0.0–5.0)
Cholesterol, Total: 133 mg/dL (ref 100–199)
HDL: 62 mg/dL (ref >39)
LDL Chol Calc (NIH): 58 mg/dL (ref 0–99)
Triglycerides: 65 mg/dL (ref 0–149)
VLDL Cholesterol Cal: 13 mg/dL (ref 5–40)

## 2019-12-20 ENCOUNTER — Other Ambulatory Visit: Payer: Self-pay | Admitting: Family Medicine

## 2019-12-20 DIAGNOSIS — Z1211 Encounter for screening for malignant neoplasm of colon: Secondary | ICD-10-CM

## 2019-12-21 ENCOUNTER — Encounter: Payer: Self-pay | Admitting: Family Medicine

## 2020-01-24 ENCOUNTER — Telehealth: Payer: Self-pay | Admitting: Family Medicine

## 2020-01-24 NOTE — Telephone Encounter (Signed)
Pts barber advised him to take Biotin for hair, skin and nails 10,000MCG / pt wanted to check with Dr. Caryn Section and make sure this wouldn't interfere with any of his medications / please advise

## 2020-01-24 NOTE — Telephone Encounter (Signed)
Patient advised.

## 2020-01-24 NOTE — Telephone Encounter (Signed)
It would be safe to take Biotin

## 2020-02-05 ENCOUNTER — Other Ambulatory Visit: Payer: Self-pay | Admitting: Family Medicine

## 2020-02-05 DIAGNOSIS — E119 Type 2 diabetes mellitus without complications: Secondary | ICD-10-CM

## 2020-02-05 NOTE — Telephone Encounter (Signed)
Requested Prescriptions  Pending Prescriptions Disp Refills   metFORMIN (GLUCOPHAGE) 1000 MG tablet [Pharmacy Med Name: METFORMIN HCL 1,000 MG TABLET] 180 tablet 0    Sig: TAKE 1 TABLET BY MOUTH TWICE A DAY     Endocrinology:  Diabetes - Biguanides Passed - 02/05/2020  8:51 AM      Passed - Cr in normal range and within 360 days    Creatinine, Ser  Date Value Ref Range Status  10/12/2019 1.16 0.76 - 1.27 mg/dL Final   Creatinine, POC  Date Value Ref Range Status  12/07/2018 n/a mg/dL Final         Passed - HBA1C is between 0 and 7.9 and within 180 days    Hgb A1c MFr Bld  Date Value Ref Range Status  10/12/2019 7.8 (H) 4.8 - 5.6 % Final    Comment:             Prediabetes: 5.7 - 6.4          Diabetes: >6.4          Glycemic control for adults with diabetes: <7.0          Passed - eGFR in normal range and within 360 days    GFR calc Af Amer  Date Value Ref Range Status  10/12/2019 74 >59 mL/min/1.73 Final   GFR calc non Af Amer  Date Value Ref Range Status  10/12/2019 64 >59 mL/min/1.73 Final         Passed - Valid encounter within last 6 months    Recent Outpatient Visits          3 months ago Type 2 diabetes mellitus with retinopathy, without long-term current use of insulin, macular edema presence unspecified, unspecified laterality, unspecified retinopathy severity ([place])   [facility] Birdie Sons, MD   9 months ago Need for influenza vaccination   [facility] Birdie Sons, MD   1 year ago Controlled type 2 diabetes mellitus with mild nonproliferative retinopathy without macular edema, without long-term current use of insulin, unspecified laterality [facility])   [facility] Birdie Sons, MD   1 year ago Controlled type 2 diabetes mellitus with mild nonproliferative retinopathy without macular edema, without long-term current use of insulin, unspecified laterality [facility])   [facility] Birdie Sons,  MD   2 years ago Annual physical exam   [place], Kirstie Peri, MD              JANUVIA 100 MG tablet [Pharmacy Med Name: JANUVIA 100 MG TABLET] 90 tablet 0    Sig: TAKE 1 TABLET BY MOUTH EVERY DAY     Endocrinology:  Diabetes - DPP-4 Inhibitors Passed - 02/05/2020  8:51 AM      Passed - HBA1C is between 0 and 7.9 and within 180 days    Hgb A1c MFr Bld  Date Value Ref Range Status  10/12/2019 7.8 (H) 4.8 - 5.6 % Final    Comment:             Prediabetes: 5.7 - 6.4          Diabetes: >6.4          Glycemic control for adults with diabetes: <7.0          Passed - Cr in normal range and within 360 days    Creatinine, Ser  Date Value Ref Range Status  10/12/2019 1.16 0.76 - 1.27 mg/dL Final   Creatinine, POC  Date Value Ref Range Status  12/07/2018 n/a mg/dL Final         Passed - Valid encounter within last 6 months    Recent Outpatient Visits          3 months ago Type 2 diabetes mellitus with retinopathy, without long-term current use of insulin, macular edema presence unspecified, unspecified laterality, unspecified retinopathy severity [facility])   [facility] Birdie Sons, MD   9 months ago Need for influenza vaccination   [facility] Birdie Sons, MD   1 year ago Controlled type 2 diabetes mellitus with mild nonproliferative retinopathy without macular edema, without long-term current use of insulin, unspecified laterality [facility])   [facility] Birdie Sons, MD   1 year ago Controlled type 2 diabetes mellitus with mild nonproliferative retinopathy without macular edema, without long-term current use of insulin, unspecified laterality [facility])   [facility] Birdie Sons, MD   2 years ago Annual physical exam   [facility] Birdie Sons, MD

## 2020-02-08 DIAGNOSIS — Z1211 Encounter for screening for malignant neoplasm of colon: Secondary | ICD-10-CM | POA: Diagnosis not present

## 2020-02-09 LAB — COLOGUARD: Cologuard: NEGATIVE

## 2020-02-14 LAB — COLOGUARD: COLOGUARD: NEGATIVE

## 2020-04-03 ENCOUNTER — Encounter: Payer: Self-pay | Admitting: Family Medicine

## 2020-04-03 DIAGNOSIS — H2512 Age-related nuclear cataract, left eye: Secondary | ICD-10-CM | POA: Diagnosis not present

## 2020-04-03 LAB — HM DIABETES EYE EXAM

## 2020-04-10 ENCOUNTER — Ambulatory Visit: Payer: Medicare PPO | Attending: Internal Medicine

## 2020-04-10 DIAGNOSIS — Z23 Encounter for immunization: Secondary | ICD-10-CM

## 2020-04-10 NOTE — Progress Notes (Signed)
   Covid-19 Vaccination Clinic  Name:  AIRIK GOODLIN    MRN: [id number] DOB: Aug 11, 1950  04/10/2020  Mr. Stefanelli was observed post Covid-19 immunization for 15 minutes without incident. He was provided with Vaccine Information Sheet and instruction to access the V-Safe system.   Mr. Siverson was instructed to call 911 with any severe reactions post vaccine: [name] Kitchen Difficulty breathing  . Swelling of face and throat  . A fast heartbeat  . A bad rash all over body  . Dizziness and weakness

## 2020-04-10 NOTE — Patient Instructions (Signed)
.   Please review the attached list of medications and notify my office if there are any errors.   . Please bring all of your medications to every appointment so we can make sure that our medication list is the same as yours.   

## 2020-04-12 ENCOUNTER — Other Ambulatory Visit: Payer: Self-pay

## 2020-04-12 ENCOUNTER — Encounter: Payer: Self-pay | Admitting: Family Medicine

## 2020-04-12 ENCOUNTER — Ambulatory Visit (INDEPENDENT_AMBULATORY_CARE_PROVIDER_SITE_OTHER): Payer: Medicare PPO | Admitting: Family Medicine

## 2020-04-12 VITALS — BP 124/64 | HR 65 | Temp 98.4°F | Ht 71.0 in | Wt 212.4 lb

## 2020-04-12 DIAGNOSIS — R001 Bradycardia, unspecified: Secondary | ICD-10-CM

## 2020-04-12 DIAGNOSIS — Z0000 Encounter for general adult medical examination without abnormal findings: Secondary | ICD-10-CM | POA: Diagnosis not present

## 2020-04-12 DIAGNOSIS — I491 Atrial premature depolarization: Secondary | ICD-10-CM

## 2020-04-12 DIAGNOSIS — B351 Tinea unguium: Secondary | ICD-10-CM

## 2020-04-12 DIAGNOSIS — E11319 Type 2 diabetes mellitus with unspecified diabetic retinopathy without macular edema: Secondary | ICD-10-CM | POA: Diagnosis not present

## 2020-04-12 DIAGNOSIS — Z125 Encounter for screening for malignant neoplasm of prostate: Secondary | ICD-10-CM

## 2020-04-12 MED ORDER — TERBINAFINE HCL 250 MG PO TABS: 250.0000 mg | ORAL_TABLET | Freq: Every day | ORAL | 0 refills | Status: DC

## 2020-04-12 NOTE — Progress Notes (Signed)
Annual Wellness Visit     Patient: Mark Fitzpatrick, Male    DOB: December 26, 1950, 69 y.o.   MRN: [id number] Visit Date: 04/12/2020  Today's Provider: Lelon Huh, MD   Chief Complaint  Patient presents with  . Medicare Wellness   Subjective    MARSHEL Fitzpatrick is a 69 y.o. male who presents today for his Annual Wellness Visit.  Patient Active Problem List   Diagnosis Date Noted  . Bradycardia, sinus 12/27/2014  . Diabetes mellitus type 2 with retinopathy ([place]) 12/27/2014  . Failure of erection 12/27/2014  . Premature atrial contractions 04/09/2013   Past Medical History:  Diagnosis Date  . Bradycardia   . Bursitis of shoulder   . Diabetes mellitus without complication ([place])   . Erectile dysfunction   . Heart murmur    as child  . History of broken nose   . Hx of dysplastic nevus 07/13/2012   L lat infrapectoral costal area, moderate  . Shoulder pain    Past Surgical History:  Procedure Laterality Date  . collar bone fracture    . COLONOSCOPY    . RECONSTRUCTION OF NOSE  1996   Social History   Socioeconomic History  . Marital status: Married    Spouse name: Not on file  . Number of children: 0  . Years of education: H/S  . Highest education level: 12th grade  Occupational History  . Occupation: Retired  Tobacco Use  . Smoking status: Former Smoker    Packs/day: 0.25    Years: 5.00    Pack years: 1.25    Types: Cigarettes    Quit date: 06/30/1984    Years since quitting: 35.8  . Smokeless tobacco: Never Used  . Tobacco comment: started smoking at age 30  Vaping Use  . Vaping Use: Never used  Substance and Sexual Activity  . Alcohol use: Not Currently  . Drug use: No  . Sexual activity: Not on file  Other Topics Concern  . Not on file  Social History Narrative  . Not on file   Social Determinants of Health   Financial Resource Strain:   . Difficulty of Paying Living Expenses: Not on file  Food Insecurity:   . Worried About Ship broker in the Last Year: Not on file  . Ran Out of Food in the Last Year: Not on file  Transportation Needs:   . Lack of Transportation (Medical): Not on file  . Lack of Transportation (Non-Medical): Not on file  Physical Activity:   . Days of Exercise per Week: Not on file  . Minutes of Exercise per Session: Not on file  Stress:   . Feeling of Stress : Not on file  Social Connections:   . Frequency of Communication with Friends and Family: Not on file  . Frequency of Social Gatherings with Friends and Family: Not on file  . Attends Religious Services: Not on file  . Active Member of Clubs or Organizations: Not on file  . Attends Archivist Meetings: Not on file  . Marital Status: Not on file  Intimate Partner Violence:   . Fear of Current or Ex-Partner: Not on file  . Emotionally Abused: Not on file  . Physically Abused: Not on file  . Sexually Abused: Not on file   Family Status  Relation Name Status  . Mother  Alive  . Father  Deceased at age 93  . PGF  Deceased at age 40s  MI  . Brother  Alive  . MGM  Deceased  . MGF  Deceased at age 29s  . PGM  Deceased at age 55  . Brother  Alive   Family History  Problem Relation Age of Onset  . Diabetes Mother   . Dementia Mother   . Hypertension Mother   . Bone cancer Father        spread to liver  . Diabetes Father   . Heart attack Paternal Grandfather   . Healthy Brother   . Ovarian cancer Maternal Grandmother   . Cancer Maternal Grandfather        unknown  . Healthy Brother    No Known Allergies     Medications: Outpatient Medications Prior to Visit  Medication Sig  . aspirin EC 81 MG tablet Take 1 tablet (81 mg total) by mouth daily.  [name] Kitchen atorvastatin (LIPITOR) 10 MG tablet TAKE 1 TABLET BY MOUTH EVERY DAY  . BIOTIN PO Take by mouth.  [name] Kitchen CINNAMON PO Take 1,000 mg by mouth 2 (two) times daily.   [name] Kitchen ELDERBERRY PO Take 1 Dose by mouth daily.  [name] Kitchen glimepiride (AMARYL) 4 MG tablet TAKE ONE-HALF TABLET (2  MG TOTAL) BY MOUTH DAILY.  [name] Kitchen JANUVIA 100 MG tablet TAKE 1 TABLET BY MOUTH EVERY DAY  . metFORMIN (GLUCOPHAGE) 1000 MG tablet TAKE 1 TABLET BY MOUTH TWICE A DAY  . pioglitazone (ACTOS) 45 MG tablet Take 1 tablet (45 mg total) by mouth daily.  . sildenafil (VIAGRA) 50 MG tablet Take 50 mg by mouth daily as needed for erectile dysfunction.   No facility-administered medications prior to visit.    No Known Allergies  Patient Care Team: Birdie Sons, MD as PCP - General (Family Medicine) Birder Robson, MD as Referring Physician (Ophthalmology)  Review of Systems    Objective    Vitals:  Most recent functional status assessment: In your present state of health, do you have any difficulty performing the following activities: 04/12/2020  Hearing? N  Vision? N  Difficulty concentrating or making decisions? N  Walking or climbing stairs? N  Dressing or bathing? N  Doing errands, shopping? N  Some recent data might be hidden   Most recent fall risk assessment: Fall Risk  04/12/2020  Falls in the past year? 0  Number falls in past yr: 0  Injury with Fall? 0  Follow up -    Most recent depression screenings: PHQ 2/9 Scores 04/12/2020 04/14/2019  PHQ - 2 Score 0 0  PHQ- 9 Score - -   Most recent cognitive screening: 6CIT Screen 04/12/2020  What Year? 0 points  What month? 0 points  What time? 0 points  Count back from 20 0 points  Months in reverse 0 points  Repeat phrase 2 points  Total Score 2   Most recent Audit-C alcohol use screening Alcohol Use Disorder Test (AUDIT) 04/12/2020  1. How often do you have a drink containing alcohol? 1  2. How many drinks containing alcohol do you have on a typical day when you are drinking? 0  3. How often do you have six or more drinks on one occasion? 0  AUDIT-C Score 1  Alcohol Brief Interventions/Follow-up AUDIT Score <7 follow-up not indicated   A score of 3 or more in women, and 4 or more in men indicates increased risk  for alcohol abuse, EXCEPT if all of the points are from question 1   No results found for any visits on 04/12/20.  Assessment & Plan     Annual wellness visit done today including the all of the following: Reviewed patient's Family Medical History Reviewed and updated list of patient's medical providers Assessment of cognitive impairment was done Assessed patient's functional ability Established a written schedule for health screening [place] Completed and Reviewed  Exercise Activities and Dietary recommendations Goals    . DIET - INCREASE WATER INTAKE     Recommend increasing water intake to 4-6 glasses a day.        Immunization History  Administered Date(s) Administered  . Fluad Quad(high Dose 65+) 04/14/2019  . Influenza Split 03/12/2012  . Influenza, High Dose Seasonal PF 04/21/2015, 03/20/2016, 05/15/2017, 05/13/2018  . Influenza,inj,Quad PF,6+ Mos 04/14/2013, 04/25/2014  . Influenza-Unspecified 04/07/2020  . PFIZER SARS-COV-2 Vaccination 08/09/2019, 09/03/2019, 04/10/2020  . Pneumococcal Conjugate-13 04/21/2015  . Pneumococcal Polysaccharide-23 05/23/2010, 10/21/2016  . Tdap 12/25/2006, 12/27/2013  . Zoster 12/27/2013  . Zoster Recombinat (Shingrix) 02/05/2018, 08/04/2018    Health Maintenance  Topic Date Due  . FOOT EXAM  10/21/2017  . URINE MICROALBUMIN  12/07/2019  . HEMOGLOBIN A1C  04/12/2020  . OPHTHALMOLOGY EXAM  04/12/2021  . Fecal DNA (Cologuard)  02/08/2023  . TETANUS/TDAP  12/28/2023  . INFLUENZA VACCINE  Completed  . COVID-19 Vaccine  Completed  . Hepatitis C Screening  Completed  . PNA vac Low Risk Adult  Completed     Discussed health benefits of physical activity, and encouraged him to engage in regular exercise appropriate for his age and condition.          The entirety of the information documented in the History of Present Illness, Review of Systems and Physical Exam were personally obtained by me. Portions of  this information were initially documented by the CMA and reviewed by me for thoroughness and accuracy.      Lelon Huh, MD  [facility] [phone] (phone) [phone] (fax)  [place]

## 2020-04-12 NOTE — Progress Notes (Signed)
Complete physical exam     Patient: Mark Fitzpatrick, Male    DOB: 07/26/1950, 69 y.o.   MRN: [id number] Visit Date: 04/12/2020  Today's Provider: Lelon Huh, MD   Chief Complaint  Patient presents with  . Abdominal Pain  . Diabetes  . Hyperlipidemia  . Medicare Wellness   Subjective    Mark Fitzpatrick is a 69 y.o. male who presents today for his yearly complete physical exam. He reports consuming a general diet. Home exercise routine includes walking 10 miles per day. He generally feels well. He reports sleeping well. He does not have additional problems to discuss today.   He reports home blood sugars are mostly normal with occasionally mild hypoglycemic symptoms with sugars in the low 70s. He just had eye exam recently from dr. Andria Meuse. He has occasional upset stomach and cramping, no change in bowel havits.      Medications: Outpatient Medications Prior to Visit  Medication Sig  . aspirin EC 81 MG tablet Take 1 tablet (81 mg total) by mouth daily.  [name] Kitchen atorvastatin (LIPITOR) 10 MG tablet TAKE 1 TABLET BY MOUTH EVERY DAY  . BIOTIN PO Take by mouth.  [name] Kitchen CINNAMON PO Take 1,000 mg by mouth 2 (two) times daily.   [name] Kitchen ELDERBERRY PO Take 1 Dose by mouth daily.  [name] Kitchen glimepiride (AMARYL) 4 MG tablet TAKE ONE-HALF TABLET (2 MG TOTAL) BY MOUTH DAILY.  [name] Kitchen JANUVIA 100 MG tablet TAKE 1 TABLET BY MOUTH EVERY DAY  . metFORMIN (GLUCOPHAGE) 1000 MG tablet TAKE 1 TABLET BY MOUTH TWICE A DAY  . pioglitazone (ACTOS) 45 MG tablet Take 1 tablet (45 mg total) by mouth daily.  . sildenafil (VIAGRA) 50 MG tablet Take 50 mg by mouth daily as needed for erectile dysfunction.   No facility-administered medications prior to visit.    No Known Allergies  Patient Care Team: Birdie Sons, MD as PCP - General (Family Medicine) Birder Robson, MD as Referring Physician (Ophthalmology)  Review of Systems  Constitutional: Negative.   HENT: Negative.   Eyes: Negative.   Respiratory:  Negative.   Cardiovascular: Negative.   Gastrointestinal: Negative.   Endocrine: Negative.   Genitourinary: Negative.   Musculoskeletal: Negative.   Skin: Negative.   Allergic/Immunologic: Negative.   Neurological: Negative.   Hematological: Negative.   Psychiatric/Behavioral: Negative.       Objective    Vitals: BP 124/64 (BP Location: Right Arm, Patient Position: Sitting, Cuff Size: Normal)   Pulse 65   Temp 98.4 F (36.9 C) (Oral)   Ht 5\' 11"  (1.803 m)   Wt 212 lb 6.4 oz (96.3 kg)   SpO2 98%   BMI 29.62 kg/m    Physical Exam  General Appearance:     Overweight male. Alert, cooperative, in no acute distress, appears stated age  Head:    Normocephalic, without obvious abnormality, atraumatic  Eyes:    PERRL, conjunctiva/corneas clear, EOM's intact, fundi    benign, both eyes       Ears:    Normal TM's and external ear canals, both ears  Nose:   Nares normal, septum midline, mucosa normal, no drainage   or sinus tenderness  Throat:   Lips, mucosa, and tongue normal; teeth and gums normal  Neck:   Supple, symmetrical, trachea midline, no adenopathy;       thyroid:  No enlargement/tenderness/nodules; no carotid   bruit or JVD  Back:     Symmetric, no curvature, ROM normal, no CVA  tenderness  Lungs:     Clear to auscultation bilaterally, respirations unlabored  Chest wall:    No tenderness or deformity  Heart:    Normal heart rate. Normal rhythm. No murmurs, rubs, or gallops.  S1 and S2 normal  Abdomen:     Soft, non-tender, bowel sounds active all four quadrants,    no masses, no organomegaly  Genitalia:    deferred  Rectal:    deferred  Extremities:   All extremities are intact. No cyanosis or edema  Pulses:   2+ and symmetric all extremities  Skin:   Skin color, texture, turgor normal, no rashes or lesions. Onychomycosis not both great toes and left 3rd toe  Lymph nodes:   Cervical, supraclavicular, and axillary nodes normal  Neurologic:   CNII-XII intact. Normal  strength, sensation and reflexes      throughout      Most recent Audit-C alcohol use screening Alcohol Use Disorder Test (AUDIT) 04/12/2020  1. How often do you have a drink containing alcohol? 1  2. How many drinks containing alcohol do you have on a typical day when you are drinking? 0  3. How often do you have six or more drinks on one occasion? 0  AUDIT-C Score 1  Alcohol Brief Interventions/Follow-up AUDIT Score <7 follow-up not indicated   A score of 3 or more in women, and 4 or more in men indicates increased risk for alcohol abuse, EXCEPT if all of the points are from question 1   No results found for any visits on 04/12/20.  Assessment & Plan      1. Annual physical exam Unremarkable exam.   2. Type 2 diabetes mellitus with retinopathy, without long-term current use of insulin, macular edema presence unspecified, unspecified laterality, unspecified retinopathy severity ([place]) Doing well current medications.  - Hemoglobin A1c  3. Bradycardia, sinus Stable, is exercising regularly   4. Premature atrial contractions Asymptomatic.   5. Prostate cancer screening  - PSA Total (Reflex To Free) ([facility] only)  6. Onychomycosis Counseled regarding potential adverse effects of long term antifungals including liver toxicity, he wishes to proceed,  start- terbinafine (LAMISIL) 250 MG tablet; Take 1 tablet (250 mg total) by mouth daily.  Dispense: 30 tablet; Refill: 0  Advised will need to check LFTs before starting second month refill.      The entirety of the information documented in the History of Present Illness, Review of Systems and Physical Exam were personally obtained by me. Portions of this information were initially documented by the CMA and reviewed by me for thoroughness and accuracy.      Lelon Huh, MD  [facility] [phone] (phone) [phone] (fax)  [place]

## 2020-04-13 LAB — PSA TOTAL (REFLEX TO FREE): Prostate Specific Ag, Serum: 1.8 ng/mL (ref 0.0–4.0)

## 2020-04-13 LAB — HEMOGLOBIN A1C
Est. average glucose Bld gHb Est-mCnc: 166 mg/dL
Hgb A1c MFr Bld: 7.4 % — ABNORMAL HIGH (ref 4.8–5.6)

## 2020-05-05 ENCOUNTER — Other Ambulatory Visit: Payer: Self-pay | Admitting: Family Medicine

## 2020-05-05 DIAGNOSIS — E119 Type 2 diabetes mellitus without complications: Secondary | ICD-10-CM

## 2020-05-08 ENCOUNTER — Encounter: Payer: Self-pay | Admitting: Family Medicine

## 2020-05-08 ENCOUNTER — Other Ambulatory Visit: Payer: Self-pay | Admitting: Family Medicine

## 2020-05-08 DIAGNOSIS — B351 Tinea unguium: Secondary | ICD-10-CM

## 2020-05-08 DIAGNOSIS — E11319 Type 2 diabetes mellitus with unspecified diabetic retinopathy without macular edema: Secondary | ICD-10-CM

## 2020-05-08 NOTE — Telephone Encounter (Signed)
Requested Prescriptions  Pending Prescriptions Disp Refills   pioglitazone (ACTOS) 45 MG tablet [Pharmacy Med Name: PIOGLITAZONE HCL 45 MG TABLET] 90 tablet 1    Sig: TAKE 1 TABLET BY MOUTH EVERY DAY     Endocrinology:  Diabetes - Glitazones - pioglitazone Failed - 05/08/2020  1:30 AM      Failed - Valid encounter within last 6 months    Recent Outpatient Visits          3 weeks ago Annual physical exam   [facility] Birdie Sons, MD   6 months ago Type 2 diabetes mellitus with retinopathy, without long-term current use of insulin, macular edema presence unspecified, unspecified laterality, unspecified retinopathy severity ([place])   [facility] Birdie Sons, MD   1 year ago Need for influenza vaccination   [facility] Birdie Sons, MD   1 year ago Controlled type 2 diabetes mellitus with mild nonproliferative retinopathy without macular edema, without long-term current use of insulin, unspecified laterality [facility])   [facility] Birdie Sons, MD   1 year ago Controlled type 2 diabetes mellitus with mild nonproliferative retinopathy without macular edema, without long-term current use of insulin, unspecified laterality [facility])   [facility] Birdie Sons, MD      Future Appointments            In 3 months Fisher, Kirstie Peri, MD [facility], [facility]           Passed - HBA1C is between 0 and 7.9 and within 180 days    Hgb A1c MFr Bld  Date Value Ref Range Status  04/12/2020 7.4 (H) 4.8 - 5.6 % Final    Comment:             Prediabetes: 5.7 - 6.4          Diabetes: >6.4          Glycemic control for adults with diabetes: <7.0

## 2020-05-12 ENCOUNTER — Other Ambulatory Visit: Payer: Self-pay | Admitting: Family Medicine

## 2020-05-12 DIAGNOSIS — B351 Tinea unguium: Secondary | ICD-10-CM

## 2020-05-12 NOTE — Telephone Encounter (Signed)
Requested medication (s) are due for refill today: yes  Requested medication (s) are on the active medication list: yes  Last refill:  04/12/20 # 30  Future visit scheduled: yes  Notes to clinic:  Please review for refill. No protocol indicated for medication refill    Requested Prescriptions  Pending Prescriptions Disp Refills   terbinafine (LAMISIL) 250 MG tablet [Pharmacy Med Name: TERBINAFINE HCL 250 MG TABLET] 30 tablet 0    Sig: TAKE 1 TABLET BY MOUTH EVERY DAY      Off-Protocol Failed - 05/12/2020 12:10 PM      Failed - Medication not assigned to a protocol, review manually.      Passed - Valid encounter within last 12 months    Recent Outpatient Visits           1 month ago Annual physical exam   [facility] Birdie Sons, MD   7 months ago Type 2 diabetes mellitus with retinopathy, without long-term current use of insulin, macular edema presence unspecified, unspecified laterality, unspecified retinopathy severity ([place])   [facility] Birdie Sons, MD   1 year ago Need for influenza vaccination   [facility] Birdie Sons, MD   1 year ago Controlled type 2 diabetes mellitus with mild nonproliferative retinopathy without macular edema, without long-term current use of insulin, unspecified laterality [facility])   [facility] Birdie Sons, MD   1 year ago Controlled type 2 diabetes mellitus with mild nonproliferative retinopathy without macular edema, without long-term current use of insulin, unspecified laterality [facility])   [facility] Birdie Sons, MD       Future Appointments             In 3 months Fisher, Kirstie Peri, MD [facility], [facility]

## 2020-06-26 ENCOUNTER — Telehealth: Payer: Self-pay | Admitting: Family Medicine

## 2020-07-14 NOTE — Telephone Encounter (Signed)
Encounter entered in error.

## 2020-08-10 ENCOUNTER — Other Ambulatory Visit: Payer: Self-pay | Admitting: Family Medicine

## 2020-08-10 DIAGNOSIS — B351 Tinea unguium: Secondary | ICD-10-CM | POA: Diagnosis not present

## 2020-08-11 LAB — HEPATIC FUNCTION PANEL
ALT: 14 IU/L (ref 0–44)
AST: 12 IU/L (ref 0–40)
Albumin: 4.5 g/dL (ref 3.8–4.8)
Alkaline Phosphatase: 59 IU/L (ref 44–121)
Bilirubin Total: 0.4 mg/dL (ref 0.0–1.2)
Bilirubin, Direct: 0.13 mg/dL (ref 0.00–0.40)
Total Protein: 7 g/dL (ref 6.0–8.5)

## 2020-08-15 ENCOUNTER — Ambulatory Visit: Payer: Medicare PPO | Admitting: Family Medicine

## 2020-08-15 ENCOUNTER — Other Ambulatory Visit: Payer: Self-pay

## 2020-08-15 VITALS — BP 146/73 | HR 65 | Temp 97.7°F | Ht 71.0 in | Wt 219.0 lb

## 2020-08-15 DIAGNOSIS — E11319 Type 2 diabetes mellitus with unspecified diabetic retinopathy without macular edema: Secondary | ICD-10-CM | POA: Diagnosis not present

## 2020-08-15 DIAGNOSIS — B351 Tinea unguium: Secondary | ICD-10-CM | POA: Diagnosis not present

## 2020-08-15 LAB — POCT GLYCOSYLATED HEMOGLOBIN (HGB A1C)
Estimated Average Glucose: 177
Hemoglobin A1C: 7.8 % — AB (ref 4.0–5.6)

## 2020-08-15 LAB — POCT UA - MICROALBUMIN: Microalbumin Ur, POC: 20 mg/L

## 2020-08-15 MED ORDER — GLIMEPIRIDE 4 MG PO TABS: 2.0000 mg | ORAL_TABLET | Freq: Two times a day (BID) | ORAL | 3 refills | Status: DC

## 2020-08-15 NOTE — Progress Notes (Signed)
Established patient visit   Patient: Mark Fitzpatrick   DOB: May 06, 1951   70 y.o. Male  MRN: [id number] Visit Date: 08/15/2020  Today's healthcare provider: Lelon Huh, MD   Chief Complaint  Patient presents with  . Diabetes   Subjective    HPI  Diabetes Mellitus Type II, Follow-up  Lab Results  Component Value Date   HGBA1C 7.4 (H) 04/12/2020   HGBA1C 7.8 (H) 10/12/2019   HGBA1C 7.1 (H) 04/14/2019   Wt Readings from Last 3 Encounters:  04/12/20 212 lb 6.4 oz (96.3 kg)  10/12/19 215 lb 6.4 oz (97.7 kg)  04/23/19 216 lb (98 kg)   Last seen for diabetes 4 months ago.  Management since then includes continue same medications. He reports excellent compliance with treatment. He is not having side effects.  Symptoms: No fatigue No foot ulcerations  No appetite changes No nausea  No paresthesia of the feet  No polydipsia  No polyuria Yes visual disturbances   No vomiting     Home blood sugar records: 100-260 range  Episodes of hypoglycemia? No    Current insulin regiment: none Most Recent Eye Exam: 04/03/2020 Current exercise: walking Current diet habits: in general, a "healthy" diet    Pertinent Labs: Lab Results  Component Value Date   CHOL 133 10/12/2019   HDL 62 10/12/2019   LDLCALC 58 10/12/2019   TRIG 65 10/12/2019   CHOLHDL 2.1 10/12/2019   Lab Results  Component Value Date   NA 139 10/12/2019   K 4.8 10/12/2019   CREATININE 1.16 10/12/2019   GFRNONAA 64 10/12/2019   GFRAA 74 10/12/2019   GLUCOSE 226 (H) 10/12/2019     ---------------------------------------------------------------------------------------------------     Medications: Outpatient Medications Prior to Visit  Medication Sig  . aspirin EC 81 MG tablet Take 1 tablet (81 mg total) by mouth daily.  [name] Kitchen atorvastatin (LIPITOR) 10 MG tablet TAKE 1 TABLET BY MOUTH EVERY DAY  . BIOTIN PO Take by mouth.  [name] Kitchen CINNAMON PO Take 1,000 mg by mouth 2 (two) times daily.   [name] Kitchen ELDERBERRY PO  Take 1 Dose by mouth daily.  [name] Kitchen glimepiride (AMARYL) 4 MG tablet TAKE ONE-HALF TABLET (2 MG TOTAL) BY MOUTH DAILY.  [name] Kitchen JANUVIA 100 MG tablet TAKE 1 TABLET BY MOUTH EVERY DAY  . metFORMIN (GLUCOPHAGE) 1000 MG tablet TAKE 1 TABLET BY MOUTH TWICE A DAY  . pioglitazone (ACTOS) 45 MG tablet TAKE 1 TABLET BY MOUTH EVERY DAY  . sildenafil (VIAGRA) 50 MG tablet Take 50 mg by mouth daily as needed for erectile dysfunction.  . terbinafine (LAMISIL) 250 MG tablet TAKE 1 TABLET BY MOUTH EVERY DAY   No facility-administered medications prior to visit.    Review of Systems     Objective    BP (!) 146/73 (BP Location: Left Arm, Patient Position: Sitting, Cuff Size: Large)   Pulse 65   Temp 97.7 F (36.5 C) (Temporal)   Ht 5\' 11"  (1.803 m)   Wt 219 lb (99.3 kg)   BMI 30.54 kg/m     Physical Exam   General appearance: Obese male, cooperative and in no acute distress Head: Normocephalic, without obvious abnormality, atraumatic Respiratory: Respirations even and unlabored, normal respiratory rate Extremities: All extremities are intact.  Skin: Skin color, texture, turgor normal. No rashes seen  Psych: Appropriate mood and affect. Neurologic: Mental status: Alert, oriented to person, place, and time, thought content appropriate.   Results for orders placed or performed in visit  on 08/15/20  POCT HgB A1C  Result Value Ref Range   Hemoglobin A1C 7.8 (A) 4.0 - 5.6 %   Estimated Average Glucose 177   POCT UA - Microalbumin  Result Value Ref Range   Microalbumin Ur, POC 20 mg/L   Creatinine, POC     Albumin/Creatinine Ratio, Urine, POC      Assessment & Plan     1. Type 2 diabetes mellitus with retinopathy, without long-term current use of insulin, macular edema presence unspecified, unspecified laterality, unspecified retinopathy severity ([facility]) A1c is climbing. Goal is to keep under 7.5 discussed changing Januvia to Rybelsus. He was previously on 2mg  glimipiride twice a day and would like to  try that first.  - glimepiride (AMARYL) 4 MG tablet; Take 0.5 tablets (2 mg total) by mouth 2 (two) times daily. Take before meals  Dispense: 30 tablet; Refill: 3  Recheck in 3 months. If still not at goal will change Januvia to Rybelsus.   2. Onychomycosis Doing well with terbinafine, no change in transaminases. Continue current medications.           The entirety of the information documented in the History of Present Illness, Review of Systems and Physical Exam were personally obtained by me. Portions of this information were initially documented by the CMA and reviewed by me for thoroughness and accuracy.      Lelon Huh, MD  [facility] [phone] (phone) [phone] (fax)  [place]

## 2020-08-15 NOTE — Patient Instructions (Addendum)
.   Please review the attached list of medications and notify my office if there are any errors.   . If you're a1c is still above 7.5 at your follow up then we can try changing Januvia to Rybelsus

## 2020-09-22 ENCOUNTER — Other Ambulatory Visit: Payer: Self-pay | Admitting: Family Medicine

## 2020-09-30 ENCOUNTER — Other Ambulatory Visit: Payer: Self-pay | Admitting: Family Medicine

## 2020-09-30 DIAGNOSIS — B351 Tinea unguium: Secondary | ICD-10-CM

## 2020-09-30 NOTE — Telephone Encounter (Signed)
Requested medication (s) are due for refill today: yes  Requested medication (s) are on the active medication list: yes  Last refill:  05/12/20  Future visit scheduled: yes  Notes to clinic:  med. Not assigned to a protocol   Requested Prescriptions  Pending Prescriptions Disp Refills   terbinafine (LAMISIL) 250 MG tablet [Pharmacy Med Name: TERBINAFINE HCL 250 MG TABLET] 30 tablet 3    Sig: TAKE 1 TABLET BY MOUTH EVERY DAY      Off-Protocol Failed - 09/30/2020  3:01 PM      Failed - Medication not assigned to a protocol, review manually.      Passed - Valid encounter within last 12 months    Recent Outpatient Visits           1 month ago Type 2 diabetes mellitus with retinopathy, without long-term current use of insulin, macular edema presence unspecified, unspecified laterality, unspecified retinopathy severity [facility])   [facility] Birdie Sons, MD   5 months ago Annual physical exam   [facility] Birdie Sons, MD   11 months ago Type 2 diabetes mellitus with retinopathy, without long-term current use of insulin, macular edema presence unspecified, unspecified laterality, unspecified retinopathy severity [facility])   [facility] Birdie Sons, MD   1 year ago Need for influenza vaccination   [facility] Birdie Sons, MD   1 year ago Controlled type 2 diabetes mellitus with mild nonproliferative retinopathy without macular edema, without long-term current use of insulin, unspecified laterality [facility])   [facility] Birdie Sons, MD       Future Appointments             In 1 month Fisher, Kirstie Peri, MD [facility], [facility]

## 2020-11-04 ENCOUNTER — Other Ambulatory Visit: Payer: Self-pay | Admitting: Family Medicine

## 2020-11-04 DIAGNOSIS — E119 Type 2 diabetes mellitus without complications: Secondary | ICD-10-CM

## 2020-11-04 DIAGNOSIS — E11319 Type 2 diabetes mellitus with unspecified diabetic retinopathy without macular edema: Secondary | ICD-10-CM

## 2020-11-04 NOTE — Telephone Encounter (Signed)
Requested Prescriptions  Pending Prescriptions Disp Refills  . pioglitazone (ACTOS) 45 MG tablet [Pharmacy Med Name: PIOGLITAZONE HCL 45 MG TABLET] 90 tablet 0    Sig: TAKE 1 TABLET BY MOUTH EVERY DAY     Endocrinology:  Diabetes - Glitazones - pioglitazone Passed - 11/04/2020  8:43 AM      Passed - HBA1C is between 0 and 7.9 and within 180 days    Hemoglobin A1C  Date Value Ref Range Status  08/15/2020 7.8 (A) 4.0 - 5.6 % Final   Hgb A1c MFr Bld  Date Value Ref Range Status  04/12/2020 7.4 (H) 4.8 - 5.6 % Final    Comment:             Prediabetes: 5.7 - 6.4          Diabetes: >6.4          Glycemic control for adults with diabetes: <7.0          Passed - Valid encounter within last 6 months    Recent Outpatient Visits          2 months ago Type 2 diabetes mellitus with retinopathy, without long-term current use of insulin, macular edema presence unspecified, unspecified laterality, unspecified retinopathy severity ([place])   [place], Kirstie Peri, MD   6 months ago Annual physical exam   [facility] Birdie Sons, MD   1 year ago Type 2 diabetes mellitus with retinopathy, without long-term current use of insulin, macular edema presence unspecified, unspecified laterality, unspecified retinopathy severity ([place])   [facility], Kirstie Peri, MD   1 year ago Need for influenza vaccination   [facility] Birdie Sons, MD   1 year ago Controlled type 2 diabetes mellitus with mild nonproliferative retinopathy without macular edema, without long-term current use of insulin, unspecified laterality [facility])   [place], Kirstie Peri, MD      Future Appointments            In 2 weeks [place], Kirstie Peri, MD [facility]           . metFORMIN (GLUCOPHAGE) 1000 MG tablet [Pharmacy Med Name: METFORMIN HCL 1,000 MG TABLET] 180 tablet 0    Sig: TAKE 1 TABLET BY MOUTH TWICE A DAY      Endocrinology:  Diabetes - Biguanides Failed - 11/04/2020  8:43 AM      Failed - Cr in normal range and within 360 days    Creatinine, Ser  Date Value Ref Range Status  10/12/2019 1.16 0.76 - 1.27 mg/dL Final   Creatinine, POC  Date Value Ref Range Status  12/07/2018 n/a mg/dL Final         Failed - eGFR in normal range and within 360 days    GFR calc Af Amer  Date Value Ref Range Status  10/12/2019 74 >59 mL/min/1.73 Final   GFR calc non Af Amer  Date Value Ref Range Status  10/12/2019 64 >59 mL/min/1.73 Final         Passed - HBA1C is between 0 and 7.9 and within 180 days    Hemoglobin A1C  Date Value Ref Range Status  08/15/2020 7.8 (A) 4.0 - 5.6 % Final   Hgb A1c MFr Bld  Date Value Ref Range Status  04/12/2020 7.4 (H) 4.8 - 5.6 % Final    Comment:             Prediabetes: 5.7 - 6.4  Diabetes: >6.4          Glycemic control for adults with diabetes: <7.0          Passed - Valid encounter within last 6 months    Recent Outpatient Visits          2 months ago Type 2 diabetes mellitus with retinopathy, without long-term current use of insulin, macular edema presence unspecified, unspecified laterality, unspecified retinopathy severity [facility])   [facility] Birdie Sons, MD   6 months ago Annual physical exam   [facility] Birdie Sons, MD   1 year ago Type 2 diabetes mellitus with retinopathy, without long-term current use of insulin, macular edema presence unspecified, unspecified laterality, unspecified retinopathy severity [facility])   [facility] Birdie Sons, MD   1 year ago Need for influenza vaccination   [facility] Birdie Sons, MD   1 year ago Controlled type 2 diabetes mellitus with mild nonproliferative retinopathy without macular edema, without long-term current use of insulin, unspecified laterality [facility])   [facility] Birdie Sons, MD      Future  Appointments            In 2 weeks Fisher, Kirstie Peri, MD [facility]           . JANUVIA 100 MG tablet [Pharmacy Med Name: JANUVIA 100 MG TABLET] 90 tablet 0    Sig: TAKE 1 TABLET BY MOUTH EVERY DAY     Endocrinology:  Diabetes - DPP-4 Inhibitors Failed - 11/04/2020  8:43 AM      Failed - Cr in normal range and within 360 days    Creatinine, Ser  Date Value Ref Range Status  10/12/2019 1.16 0.76 - 1.27 mg/dL Final   Creatinine, POC  Date Value Ref Range Status  12/07/2018 n/a mg/dL Final         Passed - HBA1C is between 0 and 7.9 and within 180 days    Hemoglobin A1C  Date Value Ref Range Status  08/15/2020 7.8 (A) 4.0 - 5.6 % Final   Hgb A1c MFr Bld  Date Value Ref Range Status  04/12/2020 7.4 (H) 4.8 - 5.6 % Final    Comment:             Prediabetes: 5.7 - 6.4          Diabetes: >6.4          Glycemic control for adults with diabetes: <7.0          Passed - Valid encounter within last 6 months    Recent Outpatient Visits          2 months ago Type 2 diabetes mellitus with retinopathy, without long-term current use of insulin, macular edema presence unspecified, unspecified laterality, unspecified retinopathy severity ([place])   [place], Donald E, MD   6 months ago Annual physical exam   [facility] Birdie Sons, MD   1 year ago Type 2 diabetes mellitus with retinopathy, without long-term current use of insulin, macular edema presence unspecified, unspecified laterality, unspecified retinopathy severity [facility])   [facility] Birdie Sons, MD   1 year ago Need for influenza vaccination   [facility] Birdie Sons, MD   1 year ago Controlled type 2 diabetes mellitus with mild nonproliferative retinopathy without macular edema, without long-term current use of insulin, unspecified laterality [facility])   [facility] Birdie Sons,  MD      Future Appointments             In 2 weeks Fisher, Kirstie Peri, MD [facility], [place]

## 2020-11-11 ENCOUNTER — Other Ambulatory Visit: Payer: Self-pay | Admitting: Family Medicine

## 2020-11-11 DIAGNOSIS — E11319 Type 2 diabetes mellitus with unspecified diabetic retinopathy without macular edema: Secondary | ICD-10-CM

## 2020-11-21 ENCOUNTER — Ambulatory Visit: Payer: Medicare PPO | Admitting: Family Medicine

## 2020-11-21 ENCOUNTER — Encounter: Payer: Self-pay | Admitting: Family Medicine

## 2020-11-21 ENCOUNTER — Other Ambulatory Visit: Payer: Self-pay

## 2020-11-21 VITALS — BP 131/73 | HR 65 | Resp 16 | Wt 216.4 lb

## 2020-11-21 DIAGNOSIS — L84 Corns and callosities: Secondary | ICD-10-CM | POA: Diagnosis not present

## 2020-11-21 DIAGNOSIS — E11319 Type 2 diabetes mellitus with unspecified diabetic retinopathy without macular edema: Secondary | ICD-10-CM | POA: Diagnosis not present

## 2020-11-21 DIAGNOSIS — B351 Tinea unguium: Secondary | ICD-10-CM | POA: Diagnosis not present

## 2020-11-21 LAB — POCT GLYCOSYLATED HEMOGLOBIN (HGB A1C): Hemoglobin A1C: 7.6 % — AB (ref 4.0–5.6)

## 2020-11-21 MED ORDER — GLIMEPIRIDE 4 MG PO TABS: 2.0000 mg | ORAL_TABLET | Freq: Two times a day (BID) | ORAL | 4 refills | Status: DC

## 2020-11-21 NOTE — Progress Notes (Signed)
Established patient visit   Patient: Mark Fitzpatrick   DOB: 10-12-1950   70 y.o. Male  MRN: [id number] Visit Date: 11/21/2020  Today's healthcare provider: Lelon Huh, MD   Chief Complaint  Patient presents with  . Diabetes  . Foot Pain    Patient reports pain of his left foot for 3 months, he states that believes that he has a callus on the side of his left foot and states that it is painful and rubs against his shoe when walking.    Subjective    HPI HPI    Foot Pain    Comments: Patient reports pain of his left foot for 3 months, he states that believes that he has a callus on the side of his left foot and states that it is painful and rubs against his shoe when walking.        Last edited by Minette Headland, CMA on 11/21/2020 10:17 AM. (History)      Diabetes Mellitus Type II, Follow-up  Lab Results  Component Value Date   HGBA1C 7.8 (A) 08/15/2020   HGBA1C 7.4 (H) 04/12/2020   HGBA1C 7.8 (H) 10/12/2019   Wt Readings from Last 3 Encounters:  11/21/20 216 lb 6.4 oz (98.2 kg)  08/15/20 219 lb (99.3 kg)  04/12/20 212 lb 6.4 oz (96.3 kg)   Last seen for diabetes 3 months ago.  Management since then includes adding glimepiride (AMARYL) 4 MG tablet; Take 0.5 tablets (2 mg total) by mouth 2 (two) times daily. Recheck in 3 months. If still not at goal will change Januvia to Rybelsus. He reports excellent compliance with treatment. He is not having side effects.  Symptoms: No fatigue No foot ulcerations  No appetite changes No nausea  No paresthesia of the feet  No polydipsia  No polyuria No visual disturbances   No vomiting     Home blood sugar records: fasting range: 93  Episodes of hypoglycemia? No    Current insulin regiment: none Most Recent Eye Exam: 04/03/2020 Current exercise: 10k steps a day Current diet habits: well balanced  Pertinent Labs: Lab Results  Component Value Date   CHOL 133 10/12/2019   HDL 62 10/12/2019   LDLCALC 58  10/12/2019   TRIG 65 10/12/2019   CHOLHDL 2.1 10/12/2019   Lab Results  Component Value Date   NA 139 10/12/2019   K 4.8 10/12/2019   CREATININE 1.16 10/12/2019   GFRNONAA 64 10/12/2019   GFRAA 74 10/12/2019   GLUCOSE 226 (H) 10/12/2019     ---------------------------------------------------------------------------------------------------  Follow up for Onychomycosis:  The patient was last seen for this 3 months ago. Changes made at last visit include none. Doing well with terbinafine, no change in transaminases. Continue current medications.  He reports excellent compliance with treatment. He feels that condition is Improved. He is not having side effects.  -----------------------------------------------------------------------------------------    No Known Allergies     Medications: Outpatient Medications Prior to Visit  Medication Sig  . aspirin EC 81 MG tablet Take 1 tablet (81 mg total) by mouth daily.  [name] Kitchen atorvastatin (LIPITOR) 10 MG tablet TAKE 1 TABLET BY MOUTH EVERY DAY  . BIOTIN PO Take by mouth.  [name] Kitchen CINNAMON PO Take 1,000 mg by mouth 2 (two) times daily.   [name] Kitchen ELDERBERRY PO Take 1 Dose by mouth daily.  [name] Kitchen glimepiride (AMARYL) 4 MG tablet Take 0.5 tablets (2 mg total) by mouth 2 (two) times daily. Take before meals  .  JANUVIA 100 MG tablet TAKE 1 TABLET BY MOUTH EVERY DAY  . metFORMIN (GLUCOPHAGE) 1000 MG tablet TAKE 1 TABLET BY MOUTH TWICE A DAY  . pioglitazone (ACTOS) 45 MG tablet TAKE 1 TABLET BY MOUTH EVERY DAY  . sildenafil (VIAGRA) 50 MG tablet Take 50 mg by mouth daily as needed for erectile dysfunction.  . terbinafine (LAMISIL) 250 MG tablet Take 1 tablet (250 mg total) by mouth daily.   No facility-administered medications prior to visit.    Review of Systems     Objective    BP 131/73   Pulse 65   Resp 16   Wt 216 lb 6.4 oz (98.2 kg)   SpO2 97%   BMI 30.18 kg/m     Physical Exam   General appearance: Overweight male, cooperative and in no  acute distress Head: Normocephalic, without obvious abnormality, atraumatic Respiratory: Respirations even and unlabored, normal respiratory rate Extremities: All extremities are intact.  Skin: Small callus left lateral distal foot besides MTP joint.   Results for orders placed or performed in visit on 11/21/20  POCT HgB A1C  Result Value Ref Range   Hemoglobin A1C 7.6 (A) 4.0 - 5.6 %   HbA1c, POC (controlled diabetic range)      Assessment & Plan     1. Type 2 diabetes mellitus with retinopathy, without long-term current use of insulin, macular edema presence unspecified, unspecified laterality, unspecified retinopathy severity ([place]) Doing well current medications.   - CBC - Comprehensive metabolic panel - Lipid panel - glimepiride (AMARYL) 4 MG tablet; Take 0.5 tablets (2 mg total) by mouth 2 (two) times daily. Take before meals  Dispense: 90 tablet; Refill: 4  2. Pre-ulcerative calluses Recommend OTC non-medicated corn pad. Suggest he consider getting diabetic surgery. Discussed referral to podiatry to have lesion pared  3. Onychomycosis Steadily improving on terbinafine.        The entirety of the information documented in the History of Present Illness, Review of Systems and Physical Exam were personally obtained by me. Portions of this information were initially documented by the CMA and reviewed by me for thoroughness and accuracy.      Lelon Huh, MD  [facility] [phone] (phone) [phone] (fax)  [place]

## 2020-11-21 NOTE — Patient Instructions (Signed)
Corns and Calluses Corns are small areas of thickened skin that form on the top, sides, or tip of a toe. Corns have a cone-shaped core with a point that can press on a nerve below. This causes pain. Calluses are areas of thickened skin that can form anywhere on the body, including the hands, fingers, palms, soles of the feet, and heels. Calluses are usually larger than corns. What are the causes? Corns and calluses are caused by rubbing (friction) or pressure, such as from shoes that are too tight or do not fit properly. What increases the risk? Corns are more likely to develop in people who have misshapen toes (toe deformities), such as hammer toes. Calluses can form with friction to any area of the skin. They are more likely to develop in people who:  Work with their hands.  Wear shoes that fit poorly, are too tight, or are high-heeled.  Have toe deformities. What are the signs or symptoms? Symptoms of a corn or callus include:  A hard growth on the skin.  Pain or tenderness under the skin.  Redness and swelling.  Increased discomfort while wearing tight-fitting shoes, if your feet are affected. If a corn or callus becomes infected, symptoms may include:  Redness and swelling that gets worse.  Pain.  Fluid, blood, or pus draining from the corn or callus.   How is this diagnosed? Corns and calluses may be diagnosed based on your symptoms, your medical history, and a physical exam. How is this treated? Treatment for corns and calluses may include:  Removing the cause of the friction or pressure. This may involve: ? Changing your shoes. ? Wearing shoe inserts (orthotics) or other protective layers in your shoes, such as a corn pad. ? Wearing gloves.  Applying medicine to the skin (topical medicine) to help soften skin in the hardened, thickened areas.  Removing layers of dead skin with a file to reduce the size of the corn or callus.  Removing the corn or callus with a  scalpel or laser.  Taking antibiotic medicines, if your corn or callus is infected.  Having surgery, if a toe deformity is the cause. Follow these instructions at home:  Take over-the-counter and prescription medicines only as told by your health care provider.  If you were prescribed an antibiotic medicine, take it as told by your health care provider. Do not stop taking it even if your condition improves.  Wear shoes that fit well. Avoid wearing high-heeled shoes and shoes that are too tight or too loose.  Wear any padding, protective layers, gloves, or orthotics as told by your health care provider.  Soak your hands or feet. Then use a file or pumice stone to soften your corn or callus. Do this as told by your health care provider.  Check your corn or callus every day for signs of infection.   Contact a health care provider if:  Your symptoms do not improve with treatment.  You have redness or swelling that gets worse.  Your corn or callus becomes painful.  You have fluid, blood, or pus coming from your corn or callus.  You have new symptoms. Get help right away if:  You develop severe pain with redness. Summary  Corns are small areas of thickened skin that form on the top, sides, or tip of a toe. These can be painful.  Calluses are areas of thickened skin that can form anywhere on the body, including the hands, fingers, palms, and soles of the   feet. Calluses are usually larger than corns.  Corns and calluses are caused by rubbing (friction) or pressure, such as from shoes that are too tight or do not fit properly.  Treatment may include wearing padding, protective layers, gloves, or orthotics as told by your health care provider. This information is not intended to replace advice given to you by your health care provider. Make sure you discuss any questions you have with your health care provider. Document Revised: 10/14/2019 Document Reviewed: 10/14/2019 Elsevier  Patient Education  2021 Elsevier Inc.  

## 2020-11-22 LAB — COMPREHENSIVE METABOLIC PANEL
ALT: 14 IU/L (ref 0–44)
AST: 14 IU/L (ref 0–40)
Albumin/Globulin Ratio: 2 (ref 1.2–2.2)
Albumin: 4.6 g/dL (ref 3.8–4.8)
Alkaline Phosphatase: 56 IU/L (ref 44–121)
BUN/Creatinine Ratio: 15 (ref 10–24)
BUN: 19 mg/dL (ref 8–27)
Bilirubin Total: 0.3 mg/dL (ref 0.0–1.2)
CO2: 24 mmol/L (ref 20–29)
Calcium: 9.2 mg/dL (ref 8.6–10.2)
Chloride: 104 mmol/L (ref 96–106)
Creatinine, Ser: 1.26 mg/dL (ref 0.76–1.27)
Globulin, Total: 2.3 g/dL (ref 1.5–4.5)
Glucose: 167 mg/dL — ABNORMAL HIGH (ref 65–99)
Potassium: 5.1 mmol/L (ref 3.5–5.2)
Sodium: 143 mmol/L (ref 134–144)
Total Protein: 6.9 g/dL (ref 6.0–8.5)
eGFR: 61 mL/min/{1.73_m2} (ref >59)

## 2020-11-22 LAB — CBC
Hematocrit: 40.2 % (ref 37.5–51.0)
Hemoglobin: 13.3 g/dL (ref 13.0–17.7)
MCH: 29.3 pg (ref 26.6–33.0)
MCHC: 33.1 g/dL (ref 31.5–35.7)
MCV: 89 fL (ref 79–97)
Platelets: 242 10*3/uL (ref 150–450)
RBC: 4.54 x10E6/uL (ref 4.14–5.80)
RDW: 12.3 % (ref 11.6–15.4)
WBC: 6.5 10*3/uL (ref 3.4–10.8)

## 2020-11-22 LAB — LIPID PANEL
Chol/HDL Ratio: 2.1 ratio (ref 0.0–5.0)
Cholesterol, Total: 137 mg/dL (ref 100–199)
HDL: 65 mg/dL (ref >39)
LDL Chol Calc (NIH): 56 mg/dL (ref 0–99)
Triglycerides: 83 mg/dL (ref 0–149)
VLDL Cholesterol Cal: 16 mg/dL (ref 5–40)

## 2020-12-24 ENCOUNTER — Other Ambulatory Visit: Payer: Self-pay | Admitting: Family Medicine

## 2020-12-24 NOTE — Telephone Encounter (Signed)
Requested Prescriptions  Pending Prescriptions Disp Refills  . atorvastatin (LIPITOR) 10 MG tablet [Pharmacy Med Name: ATORVASTATIN 10 MG TABLET] 90 tablet 2    Sig: TAKE 1 TABLET BY MOUTH EVERY DAY     Cardiovascular:  Antilipid - Statins Passed - 12/24/2020  2:35 PM      Passed - Total Cholesterol in normal range and within 360 days    Cholesterol, Total  Date Value Ref Range Status  11/21/2020 137 100 - 199 mg/dL Final         Passed - LDL in normal range and within 360 days    LDL Chol Calc (NIH)  Date Value Ref Range Status  11/21/2020 56 0 - 99 mg/dL Final         Passed - HDL in normal range and within 360 days    HDL  Date Value Ref Range Status  11/21/2020 65 >39 mg/dL Final         Passed - Triglycerides in normal range and within 360 days    Triglycerides  Date Value Ref Range Status  11/21/2020 83 0 - 149 mg/dL Final         Passed - Patient is not pregnant      Passed - Valid encounter within last 12 months    Recent Outpatient Visits          1 month ago Type 2 diabetes mellitus with retinopathy, without long-term current use of insulin, macular edema presence unspecified, unspecified laterality, unspecified retinopathy severity ([place])   [place], Kirstie Peri, MD   4 months ago Type 2 diabetes mellitus with retinopathy, without long-term current use of insulin, macular edema presence unspecified, unspecified laterality, unspecified retinopathy severity ([place])   [place], Kirstie Peri, MD   8 months ago Annual physical exam   [facility] Birdie Sons, MD   1 year ago Type 2 diabetes mellitus with retinopathy, without long-term current use of insulin, macular edema presence unspecified, unspecified laterality, unspecified retinopathy severity [facility])   [facility] Birdie Sons, MD   1 year ago Need for influenza vaccination   [facility] Birdie Sons, MD       Future Appointments            In 3 months [place], Kirstie Peri, MD [facility], [place]

## 2021-01-11 ENCOUNTER — Other Ambulatory Visit: Payer: Self-pay | Admitting: Family Medicine

## 2021-01-11 DIAGNOSIS — B351 Tinea unguium: Secondary | ICD-10-CM

## 2021-01-11 NOTE — Telephone Encounter (Signed)
Requested medication (s) are due for refill today: yes  Requested medication (s) are on the active medication list: yes   Last refill:  12/05/2020  Future visit scheduled: yes   Notes to clinic:  Medication not assigned to a protocol, review manually   Requested Prescriptions  Pending Prescriptions Disp Refills   terbinafine (LAMISIL) 250 MG tablet [Pharmacy Med Name: TERBINAFINE HCL 250 MG TABLET] 30 tablet 2    Sig: TAKE 1 TABLET BY MOUTH EVERY DAY      Off-Protocol Failed - 01/11/2021 11:24 AM      Failed - Medication not assigned to a protocol, review manually.      Passed - Valid encounter within last 12 months    Recent Outpatient Visits           1 month ago Type 2 diabetes mellitus with retinopathy, without long-term current use of insulin, macular edema presence unspecified, unspecified laterality, unspecified retinopathy severity ([place])   [facility] Birdie Sons, MD   4 months ago Type 2 diabetes mellitus with retinopathy, without long-term current use of insulin, macular edema presence unspecified, unspecified laterality, unspecified retinopathy severity [facility])   [facility] Birdie Sons, MD   9 months ago Annual physical exam   [facility] Birdie Sons, MD   1 year ago Type 2 diabetes mellitus with retinopathy, without long-term current use of insulin, macular edema presence unspecified, unspecified laterality, unspecified retinopathy severity [facility])   [facility] Birdie Sons, MD   1 year ago Need for influenza vaccination   [facility] Birdie Sons, MD       Future Appointments             In 3 months Fisher, Kirstie Peri, MD [facility], [place]

## 2021-02-01 ENCOUNTER — Other Ambulatory Visit: Payer: Self-pay | Admitting: Family Medicine

## 2021-02-01 DIAGNOSIS — E119 Type 2 diabetes mellitus without complications: Secondary | ICD-10-CM

## 2021-02-02 ENCOUNTER — Other Ambulatory Visit: Payer: Self-pay | Admitting: Family Medicine

## 2021-02-02 DIAGNOSIS — E119 Type 2 diabetes mellitus without complications: Secondary | ICD-10-CM

## 2021-02-02 DIAGNOSIS — E11319 Type 2 diabetes mellitus with unspecified diabetic retinopathy without macular edema: Secondary | ICD-10-CM

## 2021-03-20 ENCOUNTER — Ambulatory Visit: Payer: Self-pay | Admitting: *Deleted

## 2021-03-20 NOTE — Telephone Encounter (Signed)
Reason for Disposition  [1] HIGH RISK for severe COVID complications (e.g., weak immune system, age > 72 years, obesity with BMI > 25, pregnant, chronic lung disease or other chronic medical condition) AND [2] COVID symptoms (e.g., cough, fever)  (Exceptions: Already seen by PCP and no new or worsening symptoms.)  Answer Assessment - Initial Assessment Questions 1. COVID-19 DIAGNOSIS: "Who made your COVID-19 diagnosis?" "Was it confirmed by a positive lab test or self-test?" If not diagnosed by a doctor (or NP/PA), ask "Are there lots of cases (community spread) where you live?" Note: See public health department website, if unsure.     At home positive test today  2. COVID-19 EXPOSURE: "Was there any known exposure to COVID before the symptoms began?" CDC Definition of close contact: within 6 feet (2 meters) for a total of 15 minutes or more over a 24-hour period.      no 3. ONSET: "When did the COVID-19 symptoms start?"      Yesterday 03/19/21 4. WORST SYMPTOM: "What is your worst symptom?" (e.g., cough, fever, shortness of breath, muscle aches)     Cough , headache , runny nose  5. COUGH: "Do you have a cough?" If Yes, ask: "How bad is the cough?"       Yes non productive at this time  6. FEVER: "Do you have a fever?" If Yes, ask: "What is your temperature, how was it measured, and when did it start?"     no 7. RESPIRATORY STATUS: "Describe your breathing?" (e.g., shortness of breath, wheezing, unable to speak)      no 8. BETTER-SAME-WORSE: "Are you getting better, staying the same or getting worse compared to yesterday?"  If getting worse, ask, "In what way?"     [place]  9. HIGH RISK DISEASE: "Do you have any chronic medical problems?" (e.g., asthma, heart or lung disease, weak immune system, obesity, etc.)     Diabetic  10. VACCINE: "Have you had the COVID-19 vaccine?" If Yes, ask: "Which one, how many shots, when did you get it?"       [employer]  11. BOOSTER: "Have you received your COVID-19  booster?" If Yes, ask: "Which one and when did you get it?"       [place]  12. PREGNANCY: "Is there any chance you are pregnant?" "When was your last menstrual period?"       na 13. OTHER SYMPTOMS: "Do you have any other symptoms?"  (e.g., chills, fatigue, headache, loss of smell or taste, muscle pain, sore throat)       Headache, runny nose, cough  14. O2 SATURATION MONITOR:  "Do you use an oxygen saturation monitor (pulse oximeter) at home?" If Yes, ask "What is your reading (oxygen level) today?" "What is your usual oxygen saturation reading?" (e.g., 95%)       na  Protocols used: Coronavirus (COVID-19) Diagnosed or Suspected-A-AH

## 2021-03-20 NOTE — Telephone Encounter (Signed)
Pt's spouse called in for assistance. She says that pt tested positive for covid. They would like to be advised on what should they do now. She says that currently pt doesn't need an apt to be seen. They would like to know what precautions should they take, etc?   Called patient to review symptoms. C/o testing positive for covid with at home test today 03/20/21. Symptoms of headache, cough, and runny nose started yesterday 03/19/21. Denies chest pain , difficulty breathing, fever. Hx diabetic. Review quarantine precautions. Care advise given. Patient verbalized understanding of care advise and to call back or go to ED if symptoms worsen. Please advise if antiviral medication recommended.

## 2021-03-21 ENCOUNTER — Telehealth: Payer: Self-pay

## 2021-03-21 MED ORDER — NIRMATRELVIR/RITONAVIR (PAXLOVID)TABLET: 3.0000 | ORAL_TABLET | Freq: Two times a day (BID) | ORAL | 0 refills | Status: AC

## 2021-03-21 NOTE — Telephone Encounter (Signed)
Copied from [employer] [id number]. Topic: General - Other >> Mar 21, 2021 11:36 AM Holley Dexter N wrote: Reason for CRM: Pts wife called in stating talked with a nurse yesterday about pt having covid and if he symptoms got worse to give a call back to see about getting some medication> Pt wife states it is getting worse and he will need medication, as well as he has diabetes, so something that will be okay for him to take. Please advise.

## 2021-03-21 NOTE — Telephone Encounter (Signed)
Addressed in other telephone encounter

## 2021-03-21 NOTE — Telephone Encounter (Signed)
See triage note.   Thanks,   -Mickel Baas

## 2021-03-21 NOTE — Telephone Encounter (Signed)
Have sent paxlovid prescription.

## 2021-03-21 NOTE — Telephone Encounter (Signed)
Pt advised.   Thanks,   -Mark Fitzpatrick  

## 2021-04-17 NOTE — Progress Notes (Signed)
Complete Physical Exam      Patient: Mark Fitzpatrick, Male    DOB: 11-Jan-1951, 70 y.o.   MRN: [id number] Visit Date: 04/18/2021  Today's Provider: Lelon Huh, MD   Chief Complaint  Patient presents with   Annual Exam   Hyperlipidemia   Diabetes   Subjective    Mark Fitzpatrick is a 70 y.o. male who presents today for his complete physical examination He reports consuming a general diet.  Exercises regularly.  He generally feels well. He reports sleeping well. He does have additional problems to discuss today.   HPI  Pt report a small knot on his left upper arm.  He noticed it about a few months ago.   Stopped Atorvastatin when he took Paxlovid for Covid in September and states his chronic hand and knee pain resolved when he stopped taking it.     Medications: Outpatient Medications Prior to Visit  Medication Sig   aspirin EC 81 MG tablet Take 1 tablet (81 mg total) by mouth daily.   atorvastatin (LIPITOR) 10 MG tablet TAKE 1 TABLET BY MOUTH EVERY DAY   BIOTIN PO Take by mouth.   CINNAMON PO Take 1,000 mg by mouth 2 (two) times daily.    ELDERBERRY PO Take 1 Dose by mouth daily.   glimepiride (AMARYL) 4 MG tablet Take 0.5 tablets (2 mg total) by mouth 2 (two) times daily. Take before meals   JANUVIA 100 MG tablet TAKE 1 TABLET BY MOUTH EVERY DAY   metFORMIN (GLUCOPHAGE) 1000 MG tablet TAKE 1 TABLET BY MOUTH TWICE A DAY   pioglitazone (ACTOS) 45 MG tablet TAKE 1 TABLET BY MOUTH EVERY DAY   sildenafil (VIAGRA) 50 MG tablet Take 50 mg by mouth daily as needed for erectile dysfunction.   terbinafine (LAMISIL) 250 MG tablet TAKE 1 TABLET BY MOUTH EVERY DAY   No facility-administered medications prior to visit.    No Known Allergies  Patient Care Team: Birdie Sons, MD as PCP - General (Family Medicine) Birder Robson, MD as Referring Physician (Ophthalmology)  Review of Systems  Constitutional: Negative.   HENT: Negative.    Eyes: Negative.    Respiratory: Negative.    Cardiovascular: Negative.   Gastrointestinal: Negative.   Endocrine: Negative.   Genitourinary: Negative.   Musculoskeletal: Negative.   Skin: Negative.   Allergic/Immunologic: Negative.   Neurological: Negative.   Hematological: Negative.   Psychiatric/Behavioral: Negative.         Objective    Vitals: Ht 5\' 11"  (1.803 m)   Wt 212 lb (96.2 kg)   BMI 29.57 kg/m    Physical Exam   General Appearance:     Well developed, well nourished male. Alert, cooperative, in no acute distress, appears stated age  Head:    Normocephalic, without obvious abnormality, atraumatic  Eyes:    PERRL, conjunctiva/corneas clear, EOM's intact, fundi    benign, both eyes       Throat:   Lips, mucosa, and tongue normal; teeth and gums normal  Neck:   Supple, symmetrical, trachea midline, no adenopathy;       thyroid:  No enlargement/tenderness/nodules; no carotid   bruit or JVD  Back:     Symmetric, no curvature, ROM normal, no CVA tenderness  Lungs:     Clear to auscultation bilaterally, respirations unlabored  Chest wall:    No tenderness or deformity  Heart:    Normal heart rate. Normal rhythm. No murmurs, rubs, or gallops.  S1 and S2 normal  Abdomen:     Soft, non-tender, bowel sounds active all four quadrants,    no masses, no organomegaly  Genitalia:    deferred  Rectal:    deferred  Extremities:   All extremities are intact. No cyanosis or edema  Pulses:   2+ and symmetric all extremities  Skin:   Skin color, texture, turgor normal. BB sized freely mobile non-tender subcutaneous lesion left upper arm.   Lymph nodes:   Cervical, supraclavicular, and axillary nodes normal  Neurologic:   CNII-XII intact. Normal strength, sensation and reflexes      throughout      Assessment & Plan     1. Annual physical exam   2. Need for influenza vaccination  - Flu Vaccine QUAD High Dose(Fluad)  3. Type 2 diabetes mellitus with retinopathy, without long-term  current use of insulin, macular edema presence unspecified, unspecified laterality, unspecified retinopathy severity (HCC)  - Lipid panel - Comprehensive metabolic panel - Hemoglobin A1c  Currently off of statin since his hand and leg joint pains went away when he stopped taking atorvastatin last month. Anticipate trying a different statin after reviewing lab results.   4. Prostate cancer screening  - PSA Total (Reflex To Free) ([facility] only)      The entirety of the information documented in the History of Present Illness, Review of Systems and Physical Exam were personally obtained by me. Portions of this information were initially documented by the CMA and reviewed by me for thoroughness and accuracy.     Lelon Huh, MD  [facility] [phone] (phone) [phone] (fax)  [place]

## 2021-04-18 ENCOUNTER — Ambulatory Visit (INDEPENDENT_AMBULATORY_CARE_PROVIDER_SITE_OTHER): Payer: Medicare PPO | Admitting: Family Medicine

## 2021-04-18 ENCOUNTER — Encounter: Payer: Self-pay | Admitting: Family Medicine

## 2021-04-18 ENCOUNTER — Other Ambulatory Visit: Payer: Self-pay

## 2021-04-18 VITALS — Ht 71.0 in | Wt 212.0 lb

## 2021-04-18 DIAGNOSIS — E11319 Type 2 diabetes mellitus with unspecified diabetic retinopathy without macular edema: Secondary | ICD-10-CM | POA: Diagnosis not present

## 2021-04-18 DIAGNOSIS — Z0000 Encounter for general adult medical examination without abnormal findings: Secondary | ICD-10-CM | POA: Diagnosis not present

## 2021-04-18 DIAGNOSIS — Z23 Encounter for immunization: Secondary | ICD-10-CM | POA: Diagnosis not present

## 2021-04-18 DIAGNOSIS — Z125 Encounter for screening for malignant neoplasm of prostate: Secondary | ICD-10-CM

## 2021-04-18 NOTE — Patient Instructions (Signed)
.   Please review the attached list of medications and notify my office if there are any errors.   . Please bring all of your medications to every appointment so we can make sure that our medication list is the same as yours.   

## 2021-04-19 ENCOUNTER — Other Ambulatory Visit: Payer: Self-pay | Admitting: Family Medicine

## 2021-04-19 ENCOUNTER — Telehealth: Payer: Self-pay

## 2021-04-19 DIAGNOSIS — E113393 Type 2 diabetes mellitus with moderate nonproliferative diabetic retinopathy without macular edema, bilateral: Secondary | ICD-10-CM | POA: Diagnosis not present

## 2021-04-19 DIAGNOSIS — Z0100 Encounter for examination of eyes and vision without abnormal findings: Secondary | ICD-10-CM | POA: Diagnosis not present

## 2021-04-19 LAB — COMPREHENSIVE METABOLIC PANEL
ALT: 12 IU/L (ref 0–44)
AST: 13 IU/L (ref 0–40)
Albumin/Globulin Ratio: 2 (ref 1.2–2.2)
Albumin: 4.5 g/dL (ref 3.7–4.7)
Alkaline Phosphatase: 55 IU/L (ref 44–121)
BUN/Creatinine Ratio: 14 (ref 10–24)
BUN: 15 mg/dL (ref 8–27)
Bilirubin Total: 0.3 mg/dL (ref 0.0–1.2)
CO2: 22 mmol/L (ref 20–29)
Calcium: 9.5 mg/dL (ref 8.6–10.2)
Chloride: 102 mmol/L (ref 96–106)
Creatinine, Ser: 1.1 mg/dL (ref 0.76–1.27)
Globulin, Total: 2.2 g/dL (ref 1.5–4.5)
Glucose: 172 mg/dL — ABNORMAL HIGH (ref 70–99)
Potassium: 5 mmol/L (ref 3.5–5.2)
Sodium: 140 mmol/L (ref 134–144)
Total Protein: 6.7 g/dL (ref 6.0–8.5)
eGFR: 72 mL/min/{1.73_m2} (ref >59)

## 2021-04-19 LAB — LIPID PANEL
Chol/HDL Ratio: 3 ratio (ref 0.0–5.0)
Cholesterol, Total: 201 mg/dL — ABNORMAL HIGH (ref 100–199)
HDL: 66 mg/dL (ref >39)
LDL Chol Calc (NIH): 113 mg/dL — ABNORMAL HIGH (ref 0–99)
Triglycerides: 125 mg/dL (ref 0–149)
VLDL Cholesterol Cal: 22 mg/dL (ref 5–40)

## 2021-04-19 LAB — PSA TOTAL (REFLEX TO FREE): Prostate Specific Ag, Serum: 2.2 ng/mL (ref 0.0–4.0)

## 2021-04-19 LAB — HM DIABETES EYE EXAM

## 2021-04-19 LAB — HEMOGLOBIN A1C
Est. average glucose Bld gHb Est-mCnc: 177 mg/dL
Hgb A1c MFr Bld: 7.8 % — ABNORMAL HIGH (ref 4.8–5.6)

## 2021-04-19 MED ORDER — PRAVASTATIN SODIUM 20 MG PO TABS: 20.0000 mg | ORAL_TABLET | Freq: Every day | ORAL | 3 refills | Status: DC

## 2021-04-19 NOTE — Telephone Encounter (Signed)
Pt advised.  RX sent to [facility].  Apt scheduled for 07/31/2021  Thanks,   -Mickel Baas

## 2021-04-19 NOTE — Telephone Encounter (Signed)
-----   Message from Birdie Sons, MD sent at 04/19/2021  7:16 AM EDT ----- A1c is up to 7.8. Cholesterol is up to 201. Try to be more strict with diet to get sugar back down. For cholesterol start pravastatin 20mg  once a day. Please send in prescription for #[id number] x 3. Please schedule follow up for diabetes and lipids in 3 months.

## 2021-04-20 ENCOUNTER — Telehealth: Payer: Self-pay | Admitting: Family Medicine

## 2021-04-20 DIAGNOSIS — E11319 Type 2 diabetes mellitus with unspecified diabetic retinopathy without macular edema: Secondary | ICD-10-CM

## 2021-04-20 NOTE — Progress Notes (Signed)
Annual Wellness Visit     Patient: Mark Fitzpatrick, Male    DOB: [birth year], 70 y.o.   MRN: [id number] Visit Date: 04/18/2021  Today's Provider: Lelon Huh, MD    Subjective    Mark Fitzpatrick is a 70 y.o. male who presents today for his Annual Wellness Visit.  Medications: Outpatient Medications Prior to Visit  Medication Sig Note   aspirin EC 81 MG tablet Take 1 tablet (81 mg total) by mouth daily.    BIOTIN PO Take by mouth.    CINNAMON PO Take 1,000 mg by mouth 2 (two) times daily.     ELDERBERRY PO Take 1 Dose by mouth daily.    JANUVIA 100 MG tablet TAKE 1 TABLET BY MOUTH EVERY DAY    metFORMIN (GLUCOPHAGE) 1000 MG tablet TAKE 1 TABLET BY MOUTH TWICE A DAY    pioglitazone (ACTOS) 45 MG tablet TAKE 1 TABLET BY MOUTH EVERY DAY    sildenafil (VIAGRA) 50 MG tablet Take 50 mg by mouth daily as needed for erectile dysfunction.    terbinafine (LAMISIL) 250 MG tablet TAKE 1 TABLET BY MOUTH EVERY DAY    [DISCONTINUED] glimepiride (AMARYL) 4 MG tablet Take 0.5 tablets (2 mg total) by mouth 2 (two) times daily. Take before meals 04/18/2021: Muscle aches   [DISCONTINUED] atorvastatin (LIPITOR) 10 MG tablet TAKE 1 TABLET BY MOUTH EVERY DAY (Patient not taking: Reported on 04/18/2021)    No facility-administered medications prior to visit.    Allergies  Allergen Reactions   Atorvastatin     Muscle pains    Patient Care Team: Birdie Sons, MD as PCP - General (Family Medicine) Birder Robson, MD as Referring Physician (Ophthalmology)        Objective     Most recent functional status assessment: In your present state of health, do you have any difficulty performing the following activities: 08/15/2020  Hearing? N  Vision? N  Difficulty concentrating or making decisions? N  Walking or climbing stairs? N  Dressing or bathing? N  Doing errands, shopping? N  Some recent data might be hidden   Most recent fall risk assessment: Fall Risk  08/15/2020   Falls in the past year? 0  Number falls in past yr: 0  Injury with Fall? 0  Follow up -    Most recent depression screenings: PHQ 2/9 Scores 08/15/2020 04/12/2020  PHQ - 2 Score 0 0  PHQ- 9 Score 0 -   Most recent cognitive screening: 6CIT Screen 04/18/2021  What Year? 0 points  What month? 0 points  What time? 0 points  Count back from 20 0 points  Months in reverse 0 points  Repeat phrase 0 points  Total Score 0   Most recent Audit-C alcohol use screening Alcohol Use Disorder Test (AUDIT) 08/15/2020  1. How often do you have a drink containing alcohol? 0  2. How many drinks containing alcohol do you have on a typical day when you are drinking? 0  3. How often do you have six or more drinks on one occasion? 0  AUDIT-C Score 0  Alcohol Brief Interventions/Follow-up AUDIT Score <7 follow-up not indicated   A score of 3 or more in women, and 4 or more in men indicates increased risk for alcohol abuse, EXCEPT if all of the points are from question 1     Assessment & Plan     Annual wellness visit done today including the all of the following: Reviewed patient's Family  Medical History Reviewed and updated list of patient's medical providers Assessment of cognitive impairment was done Assessed patient's functional ability Established a written schedule for health screening [place] Completed and Reviewed  Exercise Activities and Dietary recommendations  Goals      DIET - INCREASE WATER INTAKE     Recommend increasing water intake to 4-6 glasses a day.         Immunization History  Administered Date(s) Administered   Fluad Quad(high Dose 65+) 04/14/2019, 04/18/2021   Influenza Split 03/12/2012   Influenza, High Dose Seasonal PF 04/21/2015, 03/20/2016, 05/15/2017, 05/13/2018   Influenza,inj,Quad PF,6+ Mos 04/14/2013, 04/25/2014   Influenza-Unspecified 04/07/2020   PFIZER(Purple Top)SARS-COV-2 Vaccination 08/09/2019, 09/03/2019, 04/10/2020    Pneumococcal Conjugate-13 04/21/2015   Pneumococcal Polysaccharide-23 05/23/2010, 10/21/2016   Tdap 12/25/2006, 12/27/2013   Zoster Recombinat (Shingrix) 02/05/2018, 08/04/2018   Zoster, Live 12/27/2013    Health Maintenance  Topic Date Due   FOOT EXAM  10/21/2017   COVID-19 Vaccine (4 - Booster for Pfizer series) 06/05/2020   OPHTHALMOLOGY EXAM  04/03/2021   URINE MICROALBUMIN  08/15/2021   HEMOGLOBIN A1C  10/17/2021   Fecal DNA (Cologuard)  02/08/2023   TETANUS/TDAP  12/28/2023   Pneumonia Vaccine 5+ Years old  Completed   INFLUENZA VACCINE  Completed   Hepatitis C Screening  Completed   Zoster Vaccines- Shingrix  Completed   HPV VACCINES  Aged Out     Discussed health benefits of physical activity, and encouraged him to engage in regular exercise appropriate for his age and condition.         The entirety of the information documented in the History of Present Illness, Review of Systems and Physical Exam were personally obtained by me. Portions of this information were initially documented by the CMA and reviewed by me for thoroughness and accuracy.     Lelon Huh, MD  [facility] [phone] (phone) [phone] (fax)  [place]

## 2021-04-20 NOTE — Telephone Encounter (Signed)
Patient called in states in show in mychart that Glimepiride is being discontinued but he is saying it shouldn't be. Please call back about this.

## 2021-04-23 MED ORDER — GLIMEPIRIDE 4 MG PO TABS: 2.0000 mg | ORAL_TABLET | Freq: Two times a day (BID) | ORAL | Status: DC

## 2021-04-23 NOTE — Telephone Encounter (Signed)
Medication list corrected.

## 2021-04-23 NOTE — Telephone Encounter (Signed)
Please review.  It looks like it was taking off at his CPE.

## 2021-05-04 ENCOUNTER — Other Ambulatory Visit: Payer: Self-pay | Admitting: Family Medicine

## 2021-05-04 DIAGNOSIS — E119 Type 2 diabetes mellitus without complications: Secondary | ICD-10-CM

## 2021-05-04 DIAGNOSIS — E11319 Type 2 diabetes mellitus with unspecified diabetic retinopathy without macular edema: Secondary | ICD-10-CM

## 2021-05-04 NOTE — Telephone Encounter (Signed)
Requested Prescriptions  Pending Prescriptions Disp Refills  . metFORMIN (GLUCOPHAGE) 1000 MG tablet [Pharmacy Med Name: METFORMIN HCL 1,000 MG TABLET] 180 tablet 1    Sig: TAKE 1 TABLET BY MOUTH TWICE A DAY     Endocrinology:  Diabetes - Biguanides Passed - 05/04/2021  1:45 AM      Passed - Cr in normal range and within 360 days    Creatinine, Ser  Date Value Ref Range Status  04/18/2021 1.10 0.76 - 1.27 mg/dL Final   Creatinine, POC  Date Value Ref Range Status  12/07/2018 n/a mg/dL Final         Passed - HBA1C is between 0 and 7.9 and within 180 days    Hgb A1c MFr Bld  Date Value Ref Range Status  04/18/2021 7.8 (H) 4.8 - 5.6 % Final    Comment:             Prediabetes: 5.7 - 6.4          Diabetes: >6.4          Glycemic control for adults with diabetes: <7.0          Passed - eGFR in normal range and within 360 days    GFR calc Af Amer  Date Value Ref Range Status  10/12/2019 74 >59 mL/min/1.73 Final   GFR calc non Af Amer  Date Value Ref Range Status  10/12/2019 64 >59 mL/min/1.73 Final   eGFR  Date Value Ref Range Status  04/18/2021 72 >59 mL/min/1.73 Final         Passed - Valid encounter within last 6 months    Recent Outpatient Visits          2 weeks ago Annual physical exam   [facility] Birdie Sons, MD   5 months ago Type 2 diabetes mellitus with retinopathy, without long-term current use of insulin, macular edema presence unspecified, unspecified laterality, unspecified retinopathy severity ([place])   [facility] Birdie Sons, MD   8 months ago Type 2 diabetes mellitus with retinopathy, without long-term current use of insulin, macular edema presence unspecified, unspecified laterality, unspecified retinopathy severity ([place])   [place], Donald E, MD   1 year ago Annual physical exam   [facility] Birdie Sons, MD   1 year ago Type 2 diabetes mellitus with retinopathy,  without long-term current use of insulin, macular edema presence unspecified, unspecified laterality, unspecified retinopathy severity [facility])   [place], Kirstie Peri, MD      Future Appointments            In 2 months Fisher, Kirstie Peri, MD [facility], [facility]           . pioglitazone (ACTOS) 45 MG tablet [Pharmacy Med Name: PIOGLITAZONE HCL 45 MG TABLET] 90 tablet 1    Sig: TAKE 1 TABLET BY MOUTH EVERY DAY     Endocrinology:  Diabetes - Glitazones - pioglitazone Passed - 05/04/2021  1:45 AM      Passed - HBA1C is between 0 and 7.9 and within 180 days    Hgb A1c MFr Bld  Date Value Ref Range Status  04/18/2021 7.8 (H) 4.8 - 5.6 % Final    Comment:             Prediabetes: 5.7 - 6.4          Diabetes: >6.4          Glycemic control for  adults with diabetes: <7.0          Passed - Valid encounter within last 6 months    Recent Outpatient Visits          2 weeks ago Annual physical exam   [facility] Birdie Sons, MD   5 months ago Type 2 diabetes mellitus with retinopathy, without long-term current use of insulin, macular edema presence unspecified, unspecified laterality, unspecified retinopathy severity ([place])   [facility] Birdie Sons, MD   8 months ago Type 2 diabetes mellitus with retinopathy, without long-term current use of insulin, macular edema presence unspecified, unspecified laterality, unspecified retinopathy severity [facility]   [facility] Birdie Sons, MD   1 year ago Annual physical exam   [facility] Birdie Sons, MD   1 year ago Type 2 diabetes mellitus with retinopathy, without long-term current use of insulin, macular edema presence unspecified, unspecified laterality, unspecified retinopathy severity [facility])   [facility] Birdie Sons, MD      Future Appointments            In 2 months Fisher, Kirstie Peri, MD [facility],  [facility]           . JANUVIA 100 MG tablet [Pharmacy Med Name: JANUVIA 100 MG TABLET] 90 tablet 1    Sig: TAKE 1 TABLET BY MOUTH EVERY DAY     Endocrinology:  Diabetes - DPP-4 Inhibitors Passed - 05/04/2021  1:45 AM      Passed - HBA1C is between 0 and 7.9 and within 180 days    Hgb A1c MFr Bld  Date Value Ref Range Status  04/18/2021 7.8 (H) 4.8 - 5.6 % Final    Comment:             Prediabetes: 5.7 - 6.4          Diabetes: >6.4          Glycemic control for adults with diabetes: <7.0          Passed - Cr in normal range and within 360 days    Creatinine, Ser  Date Value Ref Range Status  04/18/2021 1.10 0.76 - 1.27 mg/dL Final   Creatinine, POC  Date Value Ref Range Status  12/07/2018 n/a mg/dL Final         Passed - Valid encounter within last 6 months    Recent Outpatient Visits          2 weeks ago Annual physical exam   [facility] Birdie Sons, MD   5 months ago Type 2 diabetes mellitus with retinopathy, without long-term current use of insulin, macular edema presence unspecified, unspecified laterality, unspecified retinopathy severity ([place])   [facility] Birdie Sons, MD   8 months ago Type 2 diabetes mellitus with retinopathy, without long-term current use of insulin, macular edema presence unspecified, unspecified laterality, unspecified retinopathy severity ([place])   [place], Kirstie Peri, MD   1 year ago Annual physical exam   [facility] Birdie Sons, MD   1 year ago Type 2 diabetes mellitus with retinopathy, without long-term current use of insulin, macular edema presence unspecified, unspecified laterality, unspecified retinopathy severity [facility])   [place], Kirstie Peri, MD      Future Appointments            In 2 months Fisher, Kirstie Peri, MD [facility], [facility]

## 2021-06-13 ENCOUNTER — Ambulatory Visit: Payer: Medicare PPO | Admitting: Dermatology

## 2021-06-13 ENCOUNTER — Other Ambulatory Visit: Payer: Self-pay

## 2021-06-13 DIAGNOSIS — L821 Other seborrheic keratosis: Secondary | ICD-10-CM

## 2021-06-13 DIAGNOSIS — Z86018 Personal history of other benign neoplasm: Secondary | ICD-10-CM | POA: Diagnosis not present

## 2021-06-13 DIAGNOSIS — L814 Other melanin hyperpigmentation: Secondary | ICD-10-CM | POA: Diagnosis not present

## 2021-06-13 DIAGNOSIS — L578 Other skin changes due to chronic exposure to nonionizing radiation: Secondary | ICD-10-CM

## 2021-06-13 DIAGNOSIS — Z1283 Encounter for screening for malignant neoplasm of skin: Secondary | ICD-10-CM

## 2021-06-13 DIAGNOSIS — D229 Melanocytic nevi, unspecified: Secondary | ICD-10-CM

## 2021-06-13 DIAGNOSIS — D1800 Hemangioma unspecified site: Secondary | ICD-10-CM

## 2021-06-13 DIAGNOSIS — I8290 Acute embolism and thrombosis of unspecified vein: Secondary | ICD-10-CM | POA: Diagnosis not present

## 2021-06-13 NOTE — Patient Instructions (Signed)

## 2021-06-13 NOTE — Progress Notes (Signed)
° °  Follow-Up Visit   Subjective  Mark Fitzpatrick is a 70 y.o. male who presents for the following: Annual Exam. Hx of dysplastic nevus. The patient presents for Total-Body Skin Exam (TBSE) for skin cancer screening and mole check.  The patient has spots, moles and lesions to be evaluated, some may be new or changing and the patient has concerns that these could be cancer.   The following portions of the chart were reviewed this encounter and updated as appropriate:   Tobacco   Allergies   Meds   Problems   Med Hx   Surg Hx   Fam Hx      Review of Systems:  No other skin or systemic complaints except as noted in HPI or Assessment and Plan.  Objective  Well appearing patient in no apparent distress; mood and affect are within normal limits.  A full examination was performed including scalp, head, eyes, ears, nose, lips, neck, chest, axillae, abdomen, back, buttocks, bilateral upper extremities, bilateral lower extremities, hands, feet, fingers, toes, fingernails, and toenails. All findings within normal limits unless otherwise noted below.  left bicep 0.4 cm non tender papule    Assessment & Plan  Thrombus left bicep Thrombus vs Lipoma  Benign-appearing.  Observation.  Call clinic for new or changing lesions.  Recommend daily use of broad spectrum spf 30+ sunscreen to sun-exposed areas.  Discussed option of excision.  Patient may consider if changes or becomes symptomatic.  Skin cancer screening  Lentigines - Scattered tan macules - Due to sun exposure - Benign-appearing, observe - Recommend daily broad spectrum sunscreen SPF 30+ to sun-exposed areas, reapply every 2 hours as needed. - Call for any changes  Seborrheic Keratoses - Stuck-on, waxy, tan-brown papules and/or plaques  - Benign-appearing - Discussed benign etiology and prognosis. - Observe - Call for any changes  Melanocytic Nevi - Tan-brown and/or pink-flesh-colored symmetric macules and papules - Benign  appearing on exam today - Observation - Call clinic for new or changing moles - Recommend daily use of broad spectrum spf 30+ sunscreen to sun-exposed areas.   Hemangiomas - Red papules - Discussed benign nature - Observe - Call for any changes  Actinic Damage - Chronic condition, secondary to cumulative UV/sun exposure - diffuse scaly erythematous macules with underlying dyspigmentation - Recommend daily broad spectrum sunscreen SPF 30+ to sun-exposed areas, reapply every 2 hours as needed.  - Staying in the shade or wearing long sleeves, sun glasses (UVA+UVB protection) and wide brim hats (4-inch brim around the entire circumference of the hat) are also recommended for sun protection.  - Call for new or changing lesions.  History of Dysplastic Nevi Left lateral infra pectoral costal area 2014 - No evidence of recurrence today - Recommend regular full body skin exams - Recommend daily broad spectrum sunscreen SPF 30+ to sun-exposed areas, reapply every 2 hours as needed.  - Call if any new or changing lesions are noted between office visits   Skin cancer screening performed today.   Return in about 2 years (around 06/14/2023) for TBSE, HX of Dysplastic nevus .  IMarye Round, CMA, am acting as scribe for Sarina Ser, MD .  Documentation: I have reviewed the above documentation for accuracy and completeness, and I agree with the above.  Sarina Ser, MD

## 2021-06-18 ENCOUNTER — Encounter: Payer: Self-pay | Admitting: Dermatology

## 2021-07-16 ENCOUNTER — Other Ambulatory Visit: Payer: Self-pay | Admitting: Family Medicine

## 2021-07-16 NOTE — Telephone Encounter (Signed)
Requested Prescriptions  Pending Prescriptions Disp Refills   pravastatin (PRAVACHOL) 20 MG tablet [Pharmacy Med Name: PRAVASTATIN SODIUM 20 MG TAB] 90 tablet 1    Sig: TAKE 1 TABLET BY MOUTH EVERY DAY     Cardiovascular:  Antilipid - Statins Failed - 07/16/2021  1:17 PM      Failed - Total Cholesterol in normal range and within 360 days    Cholesterol, Total  Date Value Ref Range Status  04/18/2021 201 (H) 100 - 199 mg/dL Final         Failed - LDL in normal range and within 360 days    LDL Chol Calc (NIH)  Date Value Ref Range Status  04/18/2021 113 (H) 0 - 99 mg/dL Final         Passed - HDL in normal range and within 360 days    HDL  Date Value Ref Range Status  04/18/2021 66 >39 mg/dL Final         Passed - Triglycerides in normal range and within 360 days    Triglycerides  Date Value Ref Range Status  04/18/2021 125 0 - 149 mg/dL Final         Passed - Patient is not pregnant      Passed - Valid encounter within last 12 months    Recent Outpatient Visits          2 months ago Annual physical exam   [facility] Birdie Sons, MD   7 months ago Type 2 diabetes mellitus with retinopathy, without long-term current use of insulin, macular edema presence unspecified, unspecified laterality, unspecified retinopathy severity ([place])   [facility] Birdie Sons, MD   11 months ago Type 2 diabetes mellitus with retinopathy, without long-term current use of insulin, macular edema presence unspecified, unspecified laterality, unspecified retinopathy severity ([place])   [place], Kirstie Peri, MD   1 year ago Annual physical exam   [facility] Birdie Sons, MD   1 year ago Type 2 diabetes mellitus with retinopathy, without long-term current use of insulin, macular edema presence unspecified, unspecified laterality, unspecified retinopathy severity [facility])   [place], Kirstie Peri, MD       Future Appointments            In 2 weeks Fisher, Kirstie Peri, MD [facility], [facility]

## 2021-07-30 NOTE — Progress Notes (Signed)
Established patient visit   Patient: Mark Fitzpatrick   DOB: Jan 02, 1951   71 y.o. Male  MRN: [id number] Visit Date: 07/31/2021  Today's healthcare provider: Lelon Huh, MD    Subjective    HPI  Diabetes Mellitus Type II, Follow-up  Lab Results  Component Value Date   HGBA1C 7.8 (H) 04/18/2021   HGBA1C 7.6 (A) 11/21/2020   HGBA1C 7.8 (A) 08/15/2020   Wt Readings from Last 3 Encounters:  07/31/21 214 lb (97.1 kg)  04/18/21 212 lb (96.2 kg)  11/21/20 216 lb 6.4 oz (98.2 kg)   Last seen for diabetes 3 months ago.  Management since then includes. He reports good compliance with treatment. He is not having side effects.  Symptoms: No fatigue No foot ulcerations  No appetite changes No nausea  No paresthesia of the feet  No polydipsia  No polyuria No visual disturbances   No vomiting     Home blood sugar records:  70's-180  Episodes of hypoglycemia? No    Current insulin regiment: none Most Recent Eye Exam: 10/22 Current exercise: walking Current diet habits: well balanced  Pertinent Labs: Lab Results  Component Value Date   CHOL 201 (H) 04/18/2021   HDL 66 04/18/2021   LDLCALC 113 (H) 04/18/2021   TRIG 125 04/18/2021   CHOLHDL 3.0 04/18/2021   Lab Results  Component Value Date   NA 140 04/18/2021   K 5.0 04/18/2021   CREATININE 1.10 04/18/2021   EGFR 72 04/18/2021   MICROALBUR 20 08/15/2020      We did switch his statin from rosuvastatin to pravastatin a few months ago due to myalgias which he states are much better. However is staring to get a little bit of aching in his thumbs lately.  --------------------------------------------------------------------------------------------------- Patient also complains of some back pain.  He states he stumbled yesterday and since then he has been having trouble.  He states it is worst when getting up and down.  He has been unable to walk as far as he usually does due to the pain.  He was prescribed  terbinafine for onychomycosis several months ago and states nails have all grown in normally, so he has stopped taken medication and doesn't feel he needs any more.   Medications: Outpatient Medications Prior to Visit  Medication Sig   aspirin EC 81 MG tablet Take 1 tablet (81 mg total) by mouth daily.   BIOTIN PO Take by mouth.   CINNAMON PO Take 1,000 mg by mouth 2 (two) times daily.    ELDERBERRY PO Take 1 Dose by mouth daily.   glimepiride (AMARYL) 4 MG tablet Take 0.5 tablets (2 mg total) by mouth 2 (two) times daily. Take before meals   JANUVIA 100 MG tablet TAKE 1 TABLET BY MOUTH EVERY DAY   metFORMIN (GLUCOPHAGE) 1000 MG tablet TAKE 1 TABLET BY MOUTH TWICE A DAY   pioglitazone (ACTOS) 45 MG tablet TAKE 1 TABLET BY MOUTH EVERY DAY   pravastatin (PRAVACHOL) 20 MG tablet TAKE 1 TABLET BY MOUTH EVERY DAY   sildenafil (VIAGRA) 50 MG tablet Take 50 mg by mouth daily as needed for erectile dysfunction.   terbinafine (LAMISIL) 250 MG tablet TAKE 1 TABLET BY MOUTH EVERY DAY   No facility-administered medications prior to visit.    Review of Systems  Musculoskeletal:  Positive for arthralgias (knuckle pain, patient thinks it is secondary to cholesterol medication.) and back pain.      Objective    BP 121/65 (  BP Location: Right Arm, Patient Position: Sitting, Cuff Size: Normal)    Pulse 79    Temp 98.3 F (36.8 C) (Oral)    Wt 214 lb (97.1 kg)    SpO2 100%    BMI 29.85 kg/m  {Show previous vital signs (optional):23777}  Physical Exam   General: Appearance:     Well developed, well nourished male in no acute distress  Eyes:    PERRL, conjunctiva/corneas clear, EOM's intact       Lungs:     Clear to auscultation bilaterally, respirations unlabored  Heart:    Normal heart rate. Normal rhythm. No murmurs, rubs, or gallops.    MS:   All extremities are intact.  Mild tenderness left para lumbar muscles  Neurologic:   Awake, alert, oriented x 3. No apparent focal neurological defect.          Assessment & Plan     1. Type 2 diabetes mellitus with retinopathy, without long-term current use of insulin, macular edema presence unspecified, unspecified laterality, unspecified retinopathy severity ([place]) Doing well current medications. Tolerating pravastatin better than rosuvastatin, but starting to have some aches in his fingers.   - Hemoglobin A1c - Comprehensive metabolic panel - Lipid panel  2. Onychomycosis Resolved with terbinafine which he has stopped taking.   3. Strain of lumbar region, initial encounter S/p 1 day. Recommend cold packs 3-4 x a day for the next 3 days, then alternate heat/cold/         I,Elena D DeSanto,acting as a scribe for Lelon Huh, MD.,have documented all relevant documentation on the behalf of Lelon Huh, MD,as directed by  Lelon Huh, MD while in the presence of Lelon Huh, MD.   The entirety of the information documented in the History of Present Illness, Review of Systems and Physical Exam were personally obtained by me. Portions of this information were initially documented by the CMA and reviewed by me for thoroughness and accuracy.     Lelon Huh, MD  [facility] [phone] (phone) [phone] (fax)  [place]

## 2021-07-31 ENCOUNTER — Ambulatory Visit: Payer: Medicare PPO | Admitting: Family Medicine

## 2021-07-31 ENCOUNTER — Other Ambulatory Visit: Payer: Self-pay

## 2021-07-31 VITALS — BP 121/65 | HR 79 | Temp 98.3°F | Wt 214.0 lb

## 2021-07-31 DIAGNOSIS — E11319 Type 2 diabetes mellitus with unspecified diabetic retinopathy without macular edema: Secondary | ICD-10-CM

## 2021-07-31 DIAGNOSIS — S39012A Strain of muscle, fascia and tendon of lower back, initial encounter: Secondary | ICD-10-CM | POA: Diagnosis not present

## 2021-07-31 DIAGNOSIS — B351 Tinea unguium: Secondary | ICD-10-CM

## 2021-08-01 LAB — COMPREHENSIVE METABOLIC PANEL
ALT: 14 IU/L (ref 0–44)
AST: 15 IU/L (ref 0–40)
Albumin/Globulin Ratio: 2 (ref 1.2–2.2)
Albumin: 4.8 g/dL — ABNORMAL HIGH (ref 3.7–4.7)
Alkaline Phosphatase: 55 IU/L (ref 44–121)
BUN/Creatinine Ratio: 17 (ref 10–24)
BUN: 18 mg/dL (ref 8–27)
Bilirubin Total: 0.5 mg/dL (ref 0.0–1.2)
CO2: 25 mmol/L (ref 20–29)
Calcium: 10 mg/dL (ref 8.6–10.2)
Chloride: 101 mmol/L (ref 96–106)
Creatinine, Ser: 1.07 mg/dL (ref 0.76–1.27)
Globulin, Total: 2.4 g/dL (ref 1.5–4.5)
Glucose: 222 mg/dL — ABNORMAL HIGH (ref 70–99)
Potassium: 5.5 mmol/L — ABNORMAL HIGH (ref 3.5–5.2)
Sodium: 141 mmol/L (ref 134–144)
Total Protein: 7.2 g/dL (ref 6.0–8.5)
eGFR: 74 mL/min/{1.73_m2} (ref >59)

## 2021-08-01 LAB — LIPID PANEL
Chol/HDL Ratio: 2.5 ratio (ref 0.0–5.0)
Cholesterol, Total: 162 mg/dL (ref 100–199)
HDL: 66 mg/dL (ref >39)
LDL Chol Calc (NIH): 79 mg/dL (ref 0–99)
Triglycerides: 96 mg/dL (ref 0–149)
VLDL Cholesterol Cal: 17 mg/dL (ref 5–40)

## 2021-08-01 LAB — HEMOGLOBIN A1C
Est. average glucose Bld gHb Est-mCnc: 180 mg/dL
Hgb A1c MFr Bld: 7.9 % — ABNORMAL HIGH (ref 4.8–5.6)

## 2021-08-01 LAB — MICROALBUMIN / CREATININE URINE RATIO
Creatinine, Urine: 177.4 mg/dL
Microalb/Creat Ratio: 17 mg/g creat (ref 0–29)
Microalbumin, Urine: 30.8 ug/mL

## 2021-08-01 LAB — SPECIMEN STATUS REPORT

## 2021-10-12 ENCOUNTER — Other Ambulatory Visit: Payer: Self-pay | Admitting: Family Medicine

## 2021-10-28 ENCOUNTER — Other Ambulatory Visit: Payer: Self-pay | Admitting: Family Medicine

## 2021-10-28 DIAGNOSIS — E11319 Type 2 diabetes mellitus with unspecified diabetic retinopathy without macular edema: Secondary | ICD-10-CM

## 2021-10-28 DIAGNOSIS — E119 Type 2 diabetes mellitus without complications: Secondary | ICD-10-CM

## 2021-11-22 ENCOUNTER — Other Ambulatory Visit: Payer: Self-pay | Admitting: Family Medicine

## 2021-11-22 DIAGNOSIS — E11319 Type 2 diabetes mellitus with unspecified diabetic retinopathy without macular edema: Secondary | ICD-10-CM

## 2022-01-30 ENCOUNTER — Other Ambulatory Visit: Payer: Self-pay | Admitting: Family Medicine

## 2022-01-30 DIAGNOSIS — E11319 Type 2 diabetes mellitus with unspecified diabetic retinopathy without macular edema: Secondary | ICD-10-CM

## 2022-01-30 DIAGNOSIS — E119 Type 2 diabetes mellitus without complications: Secondary | ICD-10-CM

## 2022-02-13 ENCOUNTER — Other Ambulatory Visit: Payer: Self-pay | Admitting: Family Medicine

## 2022-02-13 DIAGNOSIS — E11319 Type 2 diabetes mellitus with unspecified diabetic retinopathy without macular edema: Secondary | ICD-10-CM

## 2022-02-13 NOTE — Telephone Encounter (Signed)
Requested medication (s) are due for refill today: yes  Requested medication (s) are on the active medication list: yes  Last refill:  11/22/21  Future visit scheduled: no  Notes to clinic:  Unable to refill per protocol, appointment needed.      Requested Prescriptions  Pending Prescriptions Disp Refills   glimepiride (AMARYL) 4 MG tablet [Pharmacy Med Name: GLIMEPIRIDE 4 MG TABLET] 90 tablet 0    Sig: TAKE 1/2 TABLETS (2 MG TOTAL) BY MOUTH 2 (TWO) TIMES DAILY. TAKE BEFORE MEALS     Endocrinology:  Diabetes - Sulfonylureas Failed - 02/13/2022 11:31 AM      Failed - HBA1C is between 0 and 7.9 and within 180 days    Hgb A1c MFr Bld  Date Value Ref Range Status  07/31/2021 7.9 (H) 4.8 - 5.6 % Final    Comment:             Prediabetes: 5.7 - 6.4          Diabetes: >6.4          Glycemic control for adults with diabetes: <7.0          Failed - Valid encounter within last 6 months    Recent Outpatient Visits           6 months ago Type 2 diabetes mellitus with retinopathy, without long-term current use of insulin, macular edema presence unspecified, unspecified laterality, unspecified retinopathy severity ([place])   [facility] Birdie Sons, MD   10 months ago Annual physical exam   [facility] Birdie Sons, MD   1 year ago Type 2 diabetes mellitus with retinopathy, without long-term current use of insulin, macular edema presence unspecified, unspecified laterality, unspecified retinopathy severity ([place])   [facility] Birdie Sons, MD   1 year ago Type 2 diabetes mellitus with retinopathy, without long-term current use of insulin, macular edema presence unspecified, unspecified laterality, unspecified retinopathy severity [facility])   [facility] Birdie Sons, MD   1 year ago Annual physical exam   [facility] Birdie Sons, MD              Passed - Cr in normal range and within 360 days     Creatinine, Ser  Date Value Ref Range Status  07/31/2021 1.07 0.76 - 1.27 mg/dL Final   Creatinine, POC  Date Value Ref Range Status  12/07/2018 n/a mg/dL Final

## 2022-03-10 ENCOUNTER — Other Ambulatory Visit: Payer: Self-pay | Admitting: Family Medicine

## 2022-03-10 DIAGNOSIS — E11319 Type 2 diabetes mellitus with unspecified diabetic retinopathy without macular edema: Secondary | ICD-10-CM

## 2022-04-01 ENCOUNTER — Other Ambulatory Visit: Payer: Self-pay | Admitting: Family Medicine

## 2022-04-01 DIAGNOSIS — E11319 Type 2 diabetes mellitus with unspecified diabetic retinopathy without macular edema: Secondary | ICD-10-CM

## 2022-04-01 NOTE — Telephone Encounter (Signed)
Medication Refill - Medication: glimepiride (AMARYL) 4 MG tablet  Has the patient contacted their pharmacy? Yes.     Preferred Pharmacy (with phone number or street name):  [id number] [place] [address] Phone:  [phone] Fax:  [phone]    Has the patient been seen for an appointment in the last year OR does the patient have an upcoming appointment? Yes.    Patient states he only has 10 days left on this med and needs a refill before his appt with his provider. Please assist patient further

## 2022-04-02 MED ORDER — GLIMEPIRIDE 4 MG PO TABS: ORAL_TABLET | ORAL | 0 refills | Status: DC

## 2022-04-02 NOTE — Telephone Encounter (Signed)
Requested medication (s) are due for refill today:   Yes  Requested medication (s) are on the active medication list:   Yes  Future visit scheduled:   Yes 10/24 with Dr. Caryn Section   Last ordered: 02/14/2022 #30, 0 refills  Returned because 30 day courtesy refill given in Aug.    He only has 10 pills left.   This is not enough to last until his upcoming appt.  Provider to review for refills.      Requested Prescriptions  Pending Prescriptions Disp Refills   glimepiride (AMARYL) 4 MG tablet 30 tablet 0     Endocrinology:  Diabetes - Sulfonylureas Failed - 04/01/2022  2:11 PM      Failed - HBA1C is between 0 and 7.9 and within 180 days    Hgb A1c MFr Bld  Date Value Ref Range Status  07/31/2021 7.9 (H) 4.8 - 5.6 % Final    Comment:             Prediabetes: 5.7 - 6.4          Diabetes: >6.4          Glycemic control for adults with diabetes: <7.0          Failed - Valid encounter within last 6 months    Recent Outpatient Visits           8 months ago Type 2 diabetes mellitus with retinopathy, without long-term current use of insulin, macular edema presence unspecified, unspecified laterality, unspecified retinopathy severity ([place])   [facility] Birdie Sons, MD   11 months ago Annual physical exam   [facility] Birdie Sons, MD   1 year ago Type 2 diabetes mellitus with retinopathy, without long-term current use of insulin, macular edema presence unspecified, unspecified laterality, unspecified retinopathy severity ([place])   [facility] Birdie Sons, MD   1 year ago Type 2 diabetes mellitus with retinopathy, without long-term current use of insulin, macular edema presence unspecified, unspecified laterality, unspecified retinopathy severity [facility])   [facility] Birdie Sons, MD   1 year ago Annual physical exam   [facility] Birdie Sons, MD       Future Appointments             In 3  weeks Fisher, Kirstie Peri, MD [facility]            Passed - Cr in normal range and within 360 days    Creatinine, Ser  Date Value Ref Range Status  07/31/2021 1.07 0.76 - 1.27 mg/dL Final   Creatinine, POC  Date Value Ref Range Status  12/07/2018 n/a mg/dL Final

## 2022-04-23 ENCOUNTER — Ambulatory Visit: Payer: Medicare PPO | Admitting: Family Medicine

## 2022-04-23 ENCOUNTER — Encounter: Payer: Self-pay | Admitting: Family Medicine

## 2022-04-23 VITALS — BP 128/70 | HR 53 | Ht 70.5 in | Wt 214.4 lb

## 2022-04-23 DIAGNOSIS — E11319 Type 2 diabetes mellitus with unspecified diabetic retinopathy without macular edema: Secondary | ICD-10-CM

## 2022-04-23 DIAGNOSIS — H9113 Presbycusis, bilateral: Secondary | ICD-10-CM | POA: Diagnosis not present

## 2022-04-23 DIAGNOSIS — Z23 Encounter for immunization: Secondary | ICD-10-CM | POA: Diagnosis not present

## 2022-04-23 LAB — POCT GLYCOSYLATED HEMOGLOBIN (HGB A1C)
Est. average glucose Bld gHb Est-mCnc: 163
Hemoglobin A1C: 7.3 % — AB (ref 4.0–5.6)

## 2022-04-23 MED ORDER — ROSUVASTATIN CALCIUM 5 MG PO TABS: 5.0000 mg | ORAL_TABLET | Freq: Every day | ORAL | 1 refills | Status: DC

## 2022-04-23 NOTE — Patient Instructions (Signed)
.   Please review the attached list of medications and notify my office if there are any errors.   . Please bring all of your medications to every appointment so we can make sure that our medication list is the same as yours.   

## 2022-04-23 NOTE — Progress Notes (Signed)
I,Sha'taria Tyson,acting as a Education administrator for Lelon Huh, MD.,have documented all relevant documentation on the behalf of Lelon Huh, MD,as directed by  Lelon Huh, MD while in the presence of Lelon Huh, MD.   Established patient visit   Patient: Mark Fitzpatrick   DOB: 11-01-1950   71 y.o. Male  MRN: [id number] Visit Date: 04/23/2022  Today's healthcare provider: Lelon Huh, MD   Chief Complaint  Patient presents with   Diabetes   Subjective    HPI  Diabetes Mellitus Type II, Follow-up  Lab Results  Component Value Date   HGBA1C 7.9 (H) 07/31/2021   HGBA1C 7.8 (H) 04/18/2021   HGBA1C 7.6 (A) 11/21/2020   Wt Readings from Last 3 Encounters:  04/23/22 214 lb 6.4 oz (97.3 kg)  07/31/21 214 lb (97.1 kg)  04/18/21 212 lb (96.2 kg)   Last seen for diabetes 9 months ago.  Management since then includes continue current treatment. He reports excellent compliance with treatment. He is not having side effects.  Symptoms: No fatigue No foot ulcerations  No appetite changes No nausea  No paresthesia of the feet  No polydipsia  No polyuria No visual disturbances   No vomiting     Home blood sugar records: fasting range: 80s-160s  Episodes of hypoglycemia? Yes occasional swelling and shakes   Current insulin regiment:  Most Recent Eye Exam: scheduled for tomrrow Current exercise: walking Current diet habits: well balanced  Pertinent Labs: Lab Results  Component Value Date   CHOL 162 07/31/2021   HDL 66 07/31/2021   LDLCALC 79 07/31/2021   TRIG 96 07/31/2021   CHOLHDL 2.5 07/31/2021   Lab Results  Component Value Date   NA 141 07/31/2021   K 5.5 (H) 07/31/2021   CREATININE 1.07 07/31/2021   EGFR 74 07/31/2021   MICROALBUR 20 08/15/2020   LABMICR 30.8 07/31/2021     He also report problems with hands swelling when taking pravastatin. He has since stopped taking it and swelling has improved.    ---------------------------------------------------------------------------------------------------   Medications: Outpatient Medications Prior to Visit  Medication Sig   aspirin EC 81 MG tablet Take 1 tablet (81 mg total) by mouth daily.   BIOTIN PO Take by mouth.   CINNAMON PO Take 1,000 mg by mouth 2 (two) times daily.    ELDERBERRY PO Take 1 Dose by mouth daily.   glimepiride (AMARYL) 4 MG tablet TAKE 1/2 TABLETS (2 MG TOTAL) BY MOUTH 2 (TWO) TIMES DAILY. TAKE BEFORE MEALS   JANUVIA 100 MG tablet TAKE 1 TABLET BY MOUTH EVERY DAY   metFORMIN (GLUCOPHAGE) 1000 MG tablet TAKE 1 TABLET BY MOUTH TWICE A DAY   pioglitazone (ACTOS) 45 MG tablet TAKE 1 TABLET BY MOUTH EVERY DAY   sildenafil (VIAGRA) 50 MG tablet Take 50 mg by mouth daily as needed for erectile dysfunction.   pravastatin (PRAVACHOL) 20 MG tablet TAKE 1 TABLET BY MOUTH EVERY DAY (Patient not taking: Reported on 04/23/2022)   No facility-administered medications prior to visit.    Review of Systems  Constitutional:  Negative for appetite change, chills and fever.  Respiratory:  Negative for chest tightness, shortness of breath and wheezing.   Cardiovascular:  Negative for chest pain and palpitations.  Gastrointestinal:  Negative for abdominal pain, nausea and vomiting.       Objective    BP (!) 143/75 (BP Location: Left Arm, Patient Position: Sitting, Cuff Size: Normal)   Pulse (!) 53   Ht 5' 10.5" (1.791  m)   Wt 214 lb 6.4 oz (97.3 kg)   SpO2 99%   BMI 30.33 kg/m    Physical Exam   General: Appearance:    Obese male in no acute distress  Eyes:    PERRL, conjunctiva/corneas clear, EOM's intact       Lungs:     Clear to auscultation bilaterally, respirations unlabored  Heart:    Bradycardic. Normal rhythm. No murmurs, rubs, or gallops.    MS:   All extremities are intact.    Neurologic:   Awake, alert, oriented x 3. No apparent focal neurological defect.         Results for orders placed or performed in  visit on 04/23/22  POCT HgB A1C  Result Value Ref Range   Hemoglobin A1C 7.3 (A) 4.0 - 5.6 %   Est. average glucose Bld gHb Est-mCnc 163     Assessment & Plan     1. Type 2 diabetes mellitus with retinopathy, without long-term current use of insulin, macular edema presence unspecified, unspecified laterality, unspecified retinopathy severity ([place]) Near goal. Doing well current medications.   He has stopped pravastatin due to swelling of joints of hand, and was previously intolerant to - rosuvastatin (CRESTOR) 5 MG tablet; Take 1 tablet (5 mg total) by mouth daily.  Dispense: 90 tablet; Refill: 1  2. Presbycusis of both ears He has difficulty elucidating conversation making It difficult to follow verbal arguments. 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   3. Flu vaccine need  - Flu Vaccine QUAD High Dose(Fluad)       The entirety of the information documented in the History of Present Illness, Review of Systems and Physical Exam were personally obtained by me. Portions of this information were initially documented by the CMA and reviewed by me for thoroughness and accuracy.     Lelon Huh, MD  [facility] [phone] (phone) [phone] (fax)  [place]

## 2022-04-24 DIAGNOSIS — Z0100 Encounter for examination of eyes and vision without abnormal findings: Secondary | ICD-10-CM | POA: Diagnosis not present

## 2022-04-24 DIAGNOSIS — E113393 Type 2 diabetes mellitus with moderate nonproliferative diabetic retinopathy without macular edema, bilateral: Secondary | ICD-10-CM | POA: Diagnosis not present

## 2022-04-24 DIAGNOSIS — H2512 Age-related nuclear cataract, left eye: Secondary | ICD-10-CM | POA: Diagnosis not present

## 2022-04-24 DIAGNOSIS — H2511 Age-related nuclear cataract, right eye: Secondary | ICD-10-CM | POA: Diagnosis not present

## 2022-04-24 LAB — HM DIABETES EYE EXAM

## 2022-04-27 ENCOUNTER — Other Ambulatory Visit: Payer: Self-pay | Admitting: Family Medicine

## 2022-04-27 DIAGNOSIS — E11319 Type 2 diabetes mellitus with unspecified diabetic retinopathy without macular edema: Secondary | ICD-10-CM

## 2022-04-30 ENCOUNTER — Encounter: Payer: Self-pay | Admitting: Family Medicine

## 2022-05-02 ENCOUNTER — Other Ambulatory Visit: Payer: Self-pay | Admitting: Family Medicine

## 2022-05-02 DIAGNOSIS — E119 Type 2 diabetes mellitus without complications: Secondary | ICD-10-CM

## 2022-05-02 DIAGNOSIS — E11319 Type 2 diabetes mellitus with unspecified diabetic retinopathy without macular edema: Secondary | ICD-10-CM

## 2022-05-20 ENCOUNTER — Encounter: Payer: Self-pay | Admitting: Ophthalmology

## 2022-05-21 DIAGNOSIS — H2511 Age-related nuclear cataract, right eye: Secondary | ICD-10-CM | POA: Diagnosis not present

## 2022-05-22 NOTE — Discharge Instructions (Signed)

## 2022-05-28 ENCOUNTER — Encounter: Payer: Self-pay | Admitting: Ophthalmology

## 2022-05-28 ENCOUNTER — Other Ambulatory Visit: Payer: Self-pay

## 2022-05-28 ENCOUNTER — Ambulatory Visit: Payer: Medicare PPO | Admitting: Anesthesiology

## 2022-05-28 ENCOUNTER — Ambulatory Visit
Admission: RE | Admit: 2022-05-28 | Discharge: 2022-05-28 | Disposition: A | Discharge status: Home / Self Care | Payer: Medicare PPO | Source: Ambulatory Visit | Attending: Ophthalmology | Admitting: Ophthalmology

## 2022-05-28 ENCOUNTER — Encounter
Admission: RE | Disposition: A | Discharge status: Home / Self Care | Payer: Self-pay | Source: Ambulatory Visit | Attending: Ophthalmology

## 2022-05-28 DIAGNOSIS — H2511 Age-related nuclear cataract, right eye: Secondary | ICD-10-CM | POA: Diagnosis not present

## 2022-05-28 DIAGNOSIS — E119 Type 2 diabetes mellitus without complications: Secondary | ICD-10-CM | POA: Diagnosis not present

## 2022-05-28 DIAGNOSIS — E1136 Type 2 diabetes mellitus with diabetic cataract: Secondary | ICD-10-CM | POA: Insufficient documentation

## 2022-05-28 DIAGNOSIS — I10 Essential (primary) hypertension: Secondary | ICD-10-CM | POA: Diagnosis not present

## 2022-05-28 HISTORY — PX: CATARACT EXTRACTION W/PHACO: SHX586

## 2022-05-28 LAB — GLUCOSE, CAPILLARY: Glucose-Capillary: 207 mg/dL — ABNORMAL HIGH (ref 70–99)

## 2022-05-28 SURGERY — PHACOEMULSIFICATION, CATARACT, WITH IOL INSERTION
Anesthesia: Monitor Anesthesia Care | Site: Eye | Laterality: Right

## 2022-05-28 MED ORDER — MOXIFLOXACIN HCL 0.5 % OP SOLN
OPHTHALMIC | Status: DC | PRN
  Administered 2022-05-28: .2 mL via OPHTHALMIC

## 2022-05-28 MED ORDER — FENTANYL CITRATE PF 50 MCG/ML IJ SOSY: 25.0000 ug | PREFILLED_SYRINGE | INTRAMUSCULAR | Status: DC | PRN

## 2022-05-28 MED ORDER — TETRACAINE HCL 0.5 % OP SOLN
1.0000 [drp] | OPHTHALMIC | Status: DC | PRN
  Administered 2022-05-28 (×3): 1 [drp] via OPHTHALMIC

## 2022-05-28 MED ORDER — SIGHTPATH DOSE#1 BSS IO SOLN
INTRAOCULAR | Status: DC | PRN
  Administered 2022-05-28: 15 mL

## 2022-05-28 MED ORDER — ONDANSETRON HCL 4 MG/2ML IJ SOLN: 4.0000 mg | Freq: Once | INTRAMUSCULAR | Status: DC | PRN

## 2022-05-28 MED ORDER — SIGHTPATH DOSE#1 NA CHONDROIT SULF-NA HYALURON 40-17 MG/ML IO SOLN
INTRAOCULAR | Status: DC | PRN
  Administered 2022-05-28: 1 mL via INTRAOCULAR

## 2022-05-28 MED ORDER — FENTANYL CITRATE (PF) 100 MCG/2ML IJ SOLN
INTRAMUSCULAR | Status: DC | PRN
  Administered 2022-05-28 (×2): 50 ug via INTRAVENOUS

## 2022-05-28 MED ORDER — SIGHTPATH DOSE#1 BSS IO SOLN
INTRAOCULAR | Status: DC | PRN
  Administered 2022-05-28: 1 mL via INTRAMUSCULAR

## 2022-05-28 MED ORDER — LACTATED RINGERS IV SOLN: INTRAVENOUS | Status: DC

## 2022-05-28 MED ORDER — BRIMONIDINE TARTRATE-TIMOLOL 0.2-0.5 % OP SOLN
OPHTHALMIC | Status: DC | PRN
  Administered 2022-05-28: 1 [drp] via OPHTHALMIC

## 2022-05-28 MED ORDER — ARMC OPHTHALMIC DILATING DROPS
1.0000 | OPHTHALMIC | Status: DC | PRN
  Administered 2022-05-28 (×3): 1 via OPHTHALMIC

## 2022-05-28 MED ORDER — SIGHTPATH DOSE#1 BSS IO SOLN
INTRAOCULAR | Status: DC | PRN
  Administered 2022-05-28: 64 mL via OPHTHALMIC

## 2022-05-28 MED ORDER — MIDAZOLAM HCL 2 MG/2ML IJ SOLN
INTRAMUSCULAR | Status: DC | PRN
  Administered 2022-05-28 (×2): 1 mg via INTRAVENOUS

## 2022-05-28 SURGICAL SUPPLY — 10 items
CATARACT SUITE SIGHTPATH (MISCELLANEOUS) ×1 IMPLANT
FEE CATARACT SUITE SIGHTPATH (MISCELLANEOUS) ×1 IMPLANT
GLOVE SURG ENC TEXT LTX SZ8 (GLOVE) ×1 IMPLANT
GLOVE SURG TRIUMPH 8.0 PF LTX (GLOVE) ×1 IMPLANT
LENS CLAREON VIVITY TORIC 19 ×1 IMPLANT
LENS IOL CLRN VT TRC 3 19.0 IMPLANT
NDL FILTER BLUNT 18X1 1/2 (NEEDLE) ×1 IMPLANT
NEEDLE FILTER BLUNT 18X1 1/2 (NEEDLE) ×1 IMPLANT
SYR 3ML LL SCALE MARK (SYRINGE) ×1 IMPLANT
WATER STERILE IRR 250ML POUR (IV SOLUTION) ×1 IMPLANT

## 2022-05-28 NOTE — H&P (Signed)
[facility]   Primary Care Physician:  Birdie Sons, MD Ophthalmologist: Dr. George Ina  Pre-Procedure History & Physical: HPI:  Mark Fitzpatrick is a 71 y.o. male here for cataract surgery.   Past Medical History:  Diagnosis Date   Bradycardia    Bursitis of shoulder    Diabetes mellitus without complication (HCC)    Erectile dysfunction    Heart murmur    as child   History of broken nose    Hx of dysplastic nevus 07/13/2012   L lat infrapectoral costal area, moderate   Shoulder pain     Past Surgical History:  Procedure Laterality Date   collar bone fracture     COLONOSCOPY     RECONSTRUCTION OF NOSE  1996    Prior to Admission medications   Medication Sig Start Date End Date Taking? Authorizing Provider  aspirin EC 81 MG tablet Take 1 tablet (81 mg total) by mouth daily. 04/09/13  Yes Wellington Hampshire, MD  BIOTIN PO Take by mouth.   Yes [provider]  CINNAMON PO Take 1,000 mg by mouth 2 (two) times daily.    Yes [provider]  ELDERBERRY PO Take 1 Dose by mouth daily.   Yes [provider]  glimepiride (AMARYL) 4 MG tablet TAKE 1/2 TABLETS (2 MG TOTAL) BY MOUTH 2 (TWO) TIMES DAILY. TAKE BEFORE MEALS 04/28/22  Yes Birdie Sons, MD  metFORMIN (GLUCOPHAGE) 1000 MG tablet TAKE 1 TABLET BY MOUTH TWICE A DAY 05/02/22  Yes Birdie Sons, MD  pioglitazone (ACTOS) 45 MG tablet TAKE 1 TABLET BY MOUTH EVERY DAY 05/02/22  Yes Birdie Sons, MD  rosuvastatin (CRESTOR) 5 MG tablet Take 1 tablet (5 mg total) by mouth daily. 04/23/22  Yes Birdie Sons, MD  sildenafil (VIAGRA) 50 MG tablet Take 50 mg by mouth daily as needed for erectile dysfunction.   Yes [provider]  sitaGLIPtin (JANUVIA) 100 MG tablet TAKE 1 TABLET BY MOUTH EVERY DAY 05/02/22  Yes Birdie Sons, MD    Allergies as of 04/29/2022 - Review Complete 04/23/2022  Allergen Reaction Noted   Atorvastatin  04/18/2021   Pravastatin Swelling 04/29/2022     Family History  Problem Relation Age of Onset   Diabetes Mother    Dementia Mother    Hypertension Mother    Bone cancer Father        spread to liver   Diabetes Father    Healthy Brother    Healthy Brother    Ovarian cancer Maternal Grandmother    Cancer Maternal Grandfather        unknown   Heart attack Paternal Grandfather    Colon cancer Neg Hx    Prostate cancer Neg Hx     Social History   Socioeconomic History   Marital status: Married    Spouse name: Not on file   Number of children: 0   Years of education: H/S   Highest education level: 12th grade  Occupational History   Occupation: Retired  Tobacco Use   Smoking status: Former    Packs/day: 0.25    Years: 5.00    Total pack years: 1.25    Types: Cigarettes    Quit date: 06/30/1984    Years since quitting: 37.9   Smokeless tobacco: Never   Tobacco comments:    started smoking at age 45  Vaping Use   Vaping Use: Never used  Substance and Sexual Activity   Alcohol use:  Not Currently   Drug use: No   Sexual activity: Not on file  Other Topics Concern   Not on file  Social History Narrative   Not on file   Social Determinants of Health   Financial Resource Strain: Low Risk  (12/25/2017)   Overall Financial Resource Strain (CARDIA)    Difficulty of Paying Living Expenses: Not hard at all  Food Insecurity: No Food Insecurity (12/25/2017)   Hunger Vital Sign    Worried About Running Out of Food in the Last Year: Never true    Ran Out of Food in the Last Year: Never true  Transportation Needs: No Transportation Needs (12/25/2017)   PRAPARE - Hydrologist (Medical): No    Lack of Transportation (Non-Medical): No  Physical Activity: Not on file  Stress: No Stress Concern Present (12/25/2017)   [place]    Feeling of Stress : Not at all  Social Connections: Not on file  Intimate Partner Violence: Not on  file    Review of Systems: See HPI, otherwise negative ROS  Physical Exam: BP (!) 143/66   Pulse (!) 54   Temp 98.3 F (36.8 C) (Temporal)   Resp 16   Ht '5\' 11"'$  (1.803 m)   Wt 96.1 kg   SpO2 100%   BMI 29.55 kg/m  General:   Alert, cooperative in NAD Head:  Normocephalic and atraumatic. Respiratory:  Normal work of breathing. Cardiovascular:  RRR  Impression/Plan: Mark Fitzpatrick is here for cataract surgery.  Risks, benefits, limitations, and alternatives regarding cataract surgery have been reviewed with the patient.  Questions have been answered.  All parties agreeable.   Birder Robson, MD  05/28/2022, 9:36 AM

## 2022-05-28 NOTE — Anesthesia Postprocedure Evaluation (Signed)
Anesthesia Post Note  Patient: Mark Fitzpatrick  Procedure(s) Performed: CATARACT EXTRACTION PHACO AND INTRAOCULAR LENS PLACEMENT (IOC) RIGHT DIABETIC CLAREON VIVITY TORIC LENS  9.61  00:53.8 (Right: Eye)  Patient location during evaluation: PACU Anesthesia Type: MAC Level of consciousness: awake and alert Pain management: pain level controlled Vital Signs Assessment: post-procedure vital signs reviewed and stable Respiratory status: spontaneous breathing, nonlabored ventilation, respiratory function stable and patient connected to nasal cannula oxygen Cardiovascular status: stable and blood pressure returned to baseline Postop Assessment: no apparent nausea or vomiting Anesthetic complications: no   No notable events documented.   Last Vitals:  Vitals:   05/28/22 1012 05/28/22 1015  BP: (!) 132/59 127/64  Pulse: (!) 56 60  Resp: 14 13  Temp: 36.4 C 36.4 C  SpO2: 97% 96%    Last Pain:  Vitals:   05/28/22 1015  TempSrc:   PainSc: 0-No pain                 Molli Barrows

## 2022-05-28 NOTE — Transfer of Care (Signed)
Immediate Anesthesia Transfer of Care Note  Patient: Mark Fitzpatrick  Procedure(s) Performed: CATARACT EXTRACTION PHACO AND INTRAOCULAR LENS PLACEMENT (IOC) RIGHT DIABETIC CLAREON VIVITY TORIC LENS  9.61  00:53.8 (Right: Eye)  Patient Location: PACU  Anesthesia Type: MAC  Level of Consciousness: awake, alert  and patient cooperative  Airway and Oxygen Therapy: Patient Spontanous Breathing and Patient connected to supplemental oxygen  Post-op Assessment: Post-op Vital signs reviewed, Patient's Cardiovascular Status Stable, Respiratory Function Stable, Patent Airway and No signs of Nausea or vomiting  Post-op Vital Signs: Reviewed and stable  Complications: No notable events documented.

## 2022-05-28 NOTE — Anesthesia Preprocedure Evaluation (Signed)
Anesthesia Evaluation  Patient identified by MRN, date of birth, ID band Patient awake    Reviewed: Allergy & Precautions, H&P , NPO status , Patient's Chart, lab work & pertinent test results, reviewed documented beta blocker date and time   Airway Mallampati: II  TM Distance: >3 FB Neck ROM: full    Dental no notable dental hx. (+) Teeth Intact   Pulmonary neg pulmonary ROS, former smoker   Pulmonary exam normal breath sounds clear to auscultation       Cardiovascular Exercise Tolerance: Good hypertension, negative cardio ROS + Valvular Problems/Murmurs  Rhythm:regular Rate:Normal     Neuro/Psych negative neurological ROS  negative psych ROS   GI/Hepatic negative GI ROS, Neg liver ROS,,,  Endo/Other  negative endocrine ROSdiabetes    Renal/GU      Musculoskeletal   Abdominal   Peds  Hematology negative hematology ROS (+)   Anesthesia Other Findings   Reproductive/Obstetrics negative OB ROS                             Anesthesia Physical Anesthesia Plan  ASA: 2  Anesthesia Plan: MAC   Post-op Pain Management:    Induction:   PONV Risk Score and Plan:   Airway Management Planned:   Additional Equipment:   Intra-op Plan:   Post-operative Plan:   Informed Consent: I have reviewed the patients History and Physical, chart, labs and discussed the procedure including the risks, benefits and alternatives for the proposed anesthesia with the patient or authorized representative who has indicated his/her understanding and acceptance.       Plan Discussed with: CRNA  Anesthesia Plan Comments:        Anesthesia Quick Evaluation

## 2022-05-28 NOTE — Op Note (Signed)
PREOPERATIVE DIAGNOSIS:  Nuclear sclerotic cataract of the right eye.   POSTOPERATIVE DIAGNOSIS:  Nuclear sclerotic cataract of the right eye.   OPERATIVE PROCEDURE: Procedure(s): CATARACT EXTRACTION PHACO AND INTRAOCULAR LENS PLACEMENT (IOC) RIGHT DIABETIC CLAREON VIVITY TORIC LENS  9.61  00:53.8   SURGEON:  Birder Robson, MD.   ANESTHESIA: 1.      Managed anesthesia care. 2.     0.69m of Shugarcaine was instilled following the paracentesis  Anesthesiologist: AMolli Barrows MD CRNA: FTobie Poet CRNA  COMPLICATIONS:  None.   TECHNIQUE:   Stop and chop    DESCRIPTION OF PROCEDURE:  The patient was examined and consented in the preoperative holding area where the aforementioned topical anesthesia was applied to the right eye.  The patient was brought back to the Operating Room where he was sat upright on the gurney and given a target to fixate upon while the eye was marked at the 3:00 and 9:00 position.  The patient was then reclined on the operating table.  The eye was prepped and draped in the usual sterile ophthalmic fashion and a lid speculum was placed. A paracentesis was created with the side port blade and the anterior chamber was filled with viscoelastic. A near clear corneal incision was performed with the steel keratome. A continuous curvilinear capsulorrhexis was performed with a cystotome followed by the capsulorrhexis forceps. Hydrodissection and hydrodelineation were carried out with BSS on a blunt cannula. The lens was removed in a stop and chop technique and the remaining cortical material was removed with the irrigation-aspiration handpiece. The eye was inflated with viscoelastic and the ZCT  lens  was placed in the eye and rotated to within a few degrees of the predetermined orientation.  The remaining viscoelastic was removed from the eye.  The Sinskey hook was used to rotate the toric lens into its final resting place at 179 degrees.  0. The eye was inflated to a  physiologic pressure and found to be watertight. 0.112mof Vigamox was placed in the anterior chamber.  The eye was dressed with Vigamox and Combigan. The patient was given protective glasses to wear throughout the day and a shield with which to sleep tonight. The patient was also given drops with which to begin a drop regimen today and will follow-up with me in one day. Implant Name Type Inv. Item Serial No. Manufacturer Lot No. LRB No. Used Action  CLAREON VIVITY TORIC IOL Intraocular Lens  [id number]  Right 1 Implanted   Procedure(s): CATARACT EXTRACTION PHACO AND INTRAOCULAR LENS PLACEMENT (IOC) RIGHT DIABETIC CLAREON VIVITY TORIC LENS  9.61  00:53.8 (Right)  Electronically signed: WiBirder Robson1/28/2023 10:11 AM

## 2022-05-29 ENCOUNTER — Encounter: Payer: Self-pay | Admitting: Ophthalmology

## 2022-06-03 DIAGNOSIS — H2512 Age-related nuclear cataract, left eye: Secondary | ICD-10-CM | POA: Diagnosis not present

## 2022-06-05 ENCOUNTER — Encounter: Payer: Self-pay | Admitting: Ophthalmology

## 2022-06-06 NOTE — Discharge Instructions (Signed)

## 2022-06-11 ENCOUNTER — Ambulatory Visit
Admission: RE | Admit: 2022-06-11 | Discharge: 2022-06-11 | Disposition: A | Discharge status: Home / Self Care | Payer: Medicare PPO | Source: Ambulatory Visit | Attending: Ophthalmology | Admitting: Ophthalmology

## 2022-06-11 ENCOUNTER — Ambulatory Visit: Payer: Medicare PPO | Admitting: Anesthesiology

## 2022-06-11 ENCOUNTER — Encounter: Payer: Self-pay | Admitting: Ophthalmology

## 2022-06-11 ENCOUNTER — Encounter
Admission: RE | Disposition: A | Discharge status: Home / Self Care | Payer: Self-pay | Source: Ambulatory Visit | Attending: Ophthalmology

## 2022-06-11 ENCOUNTER — Other Ambulatory Visit: Payer: Self-pay

## 2022-06-11 DIAGNOSIS — Z87891 Personal history of nicotine dependence: Secondary | ICD-10-CM | POA: Insufficient documentation

## 2022-06-11 DIAGNOSIS — E119 Type 2 diabetes mellitus without complications: Secondary | ICD-10-CM | POA: Diagnosis not present

## 2022-06-11 DIAGNOSIS — E1136 Type 2 diabetes mellitus with diabetic cataract: Secondary | ICD-10-CM | POA: Diagnosis not present

## 2022-06-11 DIAGNOSIS — H2512 Age-related nuclear cataract, left eye: Secondary | ICD-10-CM | POA: Diagnosis not present

## 2022-06-11 DIAGNOSIS — I10 Essential (primary) hypertension: Secondary | ICD-10-CM | POA: Diagnosis not present

## 2022-06-11 HISTORY — PX: CATARACT EXTRACTION W/PHACO: SHX586

## 2022-06-11 LAB — GLUCOSE, CAPILLARY: Glucose-Capillary: 143 mg/dL — ABNORMAL HIGH (ref 70–99)

## 2022-06-11 SURGERY — PHACOEMULSIFICATION, CATARACT, WITH IOL INSERTION
Anesthesia: Monitor Anesthesia Care | Site: Eye | Laterality: Left

## 2022-06-11 MED ORDER — MIDAZOLAM HCL 2 MG/2ML IJ SOLN
INTRAMUSCULAR | Status: DC | PRN
  Administered 2022-06-11: 1 mg via INTRAVENOUS

## 2022-06-11 MED ORDER — ARMC OPHTHALMIC DILATING DROPS
1.0000 | OPHTHALMIC | Status: DC | PRN
  Administered 2022-06-11 (×3): 1 via OPHTHALMIC

## 2022-06-11 MED ORDER — TETRACAINE HCL 0.5 % OP SOLN
1.0000 [drp] | OPHTHALMIC | Status: DC | PRN
  Administered 2022-06-11 (×3): 1 [drp] via OPHTHALMIC

## 2022-06-11 MED ORDER — SIGHTPATH DOSE#1 BSS IO SOLN
INTRAOCULAR | Status: DC | PRN
  Administered 2022-06-11: 2 mL

## 2022-06-11 MED ORDER — SIGHTPATH DOSE#1 BSS IO SOLN
INTRAOCULAR | Status: DC | PRN
  Administered 2022-06-11: 57 mL via OPHTHALMIC

## 2022-06-11 MED ORDER — BRIMONIDINE TARTRATE-TIMOLOL 0.2-0.5 % OP SOLN
OPHTHALMIC | Status: DC | PRN
  Administered 2022-06-11: 1 [drp] via OPHTHALMIC

## 2022-06-11 MED ORDER — SIGHTPATH DOSE#1 NA CHONDROIT SULF-NA HYALURON 40-17 MG/ML IO SOLN
INTRAOCULAR | Status: DC | PRN
  Administered 2022-06-11: 1 mL via INTRAOCULAR

## 2022-06-11 MED ORDER — SODIUM CHLORIDE 0.9% FLUSH
INTRAVENOUS | Status: DC | PRN
  Administered 2022-06-11: 10 mL via INTRAVENOUS

## 2022-06-11 MED ORDER — MOXIFLOXACIN HCL 0.5 % OP SOLN
OPHTHALMIC | Status: DC | PRN
  Administered 2022-06-11: .2 mL via OPHTHALMIC

## 2022-06-11 MED ORDER — SIGHTPATH DOSE#1 BSS IO SOLN
INTRAOCULAR | Status: DC | PRN
  Administered 2022-06-11: 15 mL via INTRAOCULAR

## 2022-06-11 SURGICAL SUPPLY — 13 items
CANNULA ANT/CHMB 27G (MISCELLANEOUS) IMPLANT
CANNULA ANT/CHMB 27GA (MISCELLANEOUS) IMPLANT
CATARACT SUITE SIGHTPATH (MISCELLANEOUS) ×1 IMPLANT
FEE CATARACT SUITE SIGHTPATH (MISCELLANEOUS) ×1 IMPLANT
GLOVE SURG ENC TEXT LTX SZ8 (GLOVE) ×1 IMPLANT
GLOVE SURG TRIUMPH 8.0 PF LTX (GLOVE) ×1 IMPLANT
LENS CLAREON VIVITY TORIC 20 ×1 IMPLANT
LENS CLRN VIVITY TORIC 5 20 ×1 IMPLANT
LENS IOL CLRN VT TRC 5 20.0 IMPLANT
NDL FILTER BLUNT 18X1 1/2 (NEEDLE) ×1 IMPLANT
NEEDLE FILTER BLUNT 18X1 1/2 (NEEDLE) ×1 IMPLANT
SYR 3ML LL SCALE MARK (SYRINGE) ×1 IMPLANT
WATER STERILE IRR 250ML POUR (IV SOLUTION) ×1 IMPLANT

## 2022-06-11 NOTE — Anesthesia Postprocedure Evaluation (Signed)
Anesthesia Post Note  Patient: NEPHI SAVAGE  Procedure(s) Performed: CATARACT EXTRACTION PHACO AND INTRAOCULAR LENS PLACEMENT (IOC) LEFT DIABETIC CLAREON VIVITY TORIC LENS 9.95 00:58.2 (Left: Eye)  Patient location during evaluation: PACU Anesthesia Type: MAC Level of consciousness: awake and alert Pain management: pain level controlled Vital Signs Assessment: post-procedure vital signs reviewed and stable Respiratory status: spontaneous breathing, nonlabored ventilation, respiratory function stable and patient connected to nasal cannula oxygen Cardiovascular status: stable and blood pressure returned to baseline Postop Assessment: no apparent nausea or vomiting Anesthetic complications: no   No notable events documented.   Last Vitals:  Vitals:   06/11/22 0755 06/11/22 0800  BP: 134/73 136/72  Pulse: (!) 59 62  Resp: 18   Temp: 36.6 C (!) 36.4 C  SpO2: 97% 97%    Last Pain:  Vitals:   06/11/22 0800  TempSrc:   PainSc: 0-No pain                 Arita Miss

## 2022-06-11 NOTE — Anesthesia Preprocedure Evaluation (Signed)
Anesthesia Evaluation  Patient identified by MRN, date of birth, ID band Patient awake    Reviewed: Allergy & Precautions, H&P , NPO status , Patient's Chart, lab work & pertinent test results, reviewed documented beta blocker date and time   History of Anesthesia Complications Negative for: history of anesthetic complications  Airway Mallampati: II  TM Distance: >3 FB Neck ROM: full    Dental no notable dental hx. (+) Teeth Intact   Pulmonary neg pulmonary ROS, neg sleep apnea, neg COPD, Patient abstained from smoking.Not current smoker, former smoker   Pulmonary exam normal breath sounds clear to auscultation       Cardiovascular Exercise Tolerance: Good METShypertension, (-) CAD and (-) Past MI (-) dysrhythmias + Valvular Problems/Murmurs  Rhythm:regular Rate:Normal     Neuro/Psych negative neurological ROS  negative psych ROS   GI/Hepatic negative GI ROS, Neg liver ROS,neg GERD  ,,  Endo/Other  diabetes    Renal/GU negative Renal ROS     Musculoskeletal   Abdominal   Peds  Hematology negative hematology ROS (+)   Anesthesia Other Findings Past Medical History: No date: Bradycardia No date: Bursitis of shoulder No date: Diabetes mellitus without complication (HCC) No date: Erectile dysfunction No date: Heart murmur     Comment:  as child No date: History of broken nose 07/13/2012: Hx of dysplastic nevus     Comment:  L lat infrapectoral costal area, moderate No date: Shoulder pain  Reproductive/Obstetrics negative OB ROS                              Anesthesia Physical Anesthesia Plan  ASA: 2  Anesthesia Plan: MAC   Post-op Pain Management:    Induction: Intravenous  PONV Risk Score and Plan: 1 and Midazolam  Airway Management Planned: Nasal Cannula  Additional Equipment:   Intra-op Plan:   Post-operative Plan:   Informed Consent: I have reviewed the patients  History and Physical, chart, labs and discussed the procedure including the risks, benefits and alternatives for the proposed anesthesia with the patient or authorized representative who has indicated his/her understanding and acceptance.       Plan Discussed with: CRNA  Anesthesia Plan Comments: (Explained risks of anesthesia, including PONV, and rare emergencies such as cardiac events, respiratory problems, and allergic reactions, requiring invasive intervention. Discussed the role of CRNA in patient's perioperative care. Patient understands. )        Anesthesia Quick Evaluation

## 2022-06-11 NOTE — Transfer of Care (Signed)
Immediate Anesthesia Transfer of Care Note  Patient: Mark Fitzpatrick  Procedure(s) Performed: CATARACT EXTRACTION PHACO AND INTRAOCULAR LENS PLACEMENT (IOC) LEFT DIABETIC CLAREON VIVITY TORIC LENS 9.95 00:58.2 (Left: Eye)  Patient Location: PACU  Anesthesia Type: MAC  Level of Consciousness: awake, alert  and patient cooperative  Airway and Oxygen Therapy: Patient Spontanous Breathing and Patient connected to supplemental oxygen  Post-op Assessment: Post-op Vital signs reviewed, Patient's Cardiovascular Status Stable, Respiratory Function Stable, Patent Airway and No signs of Nausea or vomiting  Post-op Vital Signs: Reviewed and stable  Complications: No notable events documented.

## 2022-06-11 NOTE — H&P (Signed)
[facility]   Primary Care Physician:  Birdie Sons, MD Ophthalmologist: Dr. George Ina  Pre-Procedure History & Physical: HPI:  Mark Fitzpatrick is a 71 y.o. male here for cataract surgery.   Past Medical History:  Diagnosis Date   Bradycardia    Bursitis of shoulder    Diabetes mellitus without complication ([place])    Erectile dysfunction    Heart murmur    as child   History of broken nose    Hx of dysplastic nevus 07/13/2012   L lat infrapectoral costal area, moderate   Shoulder pain     Past Surgical History:  Procedure Laterality Date   CATARACT EXTRACTION W/PHACO Right 05/28/2022   Procedure: CATARACT EXTRACTION PHACO AND INTRAOCULAR LENS PLACEMENT (IOC) RIGHT DIABETIC CLAREON VIVITY TORIC LENS  9.61  00:53.8;  Surgeon: Birder Robson, MD;  Location: [place];  Service: Ophthalmology;  Laterality: Right;   collar bone fracture     COLONOSCOPY     RECONSTRUCTION OF NOSE  1996    Prior to Admission medications   Medication Sig Start Date End Date Taking? Authorizing Provider  aspirin EC 81 MG tablet Take 1 tablet (81 mg total) by mouth daily. 04/09/13  Yes Wellington Hampshire, MD  BIOTIN PO Take by mouth.   Yes [provider]  CINNAMON PO Take 1,000 mg by mouth 2 (two) times daily.    Yes [provider]  ELDERBERRY PO Take 1 Dose by mouth daily.   Yes [provider]  glimepiride (AMARYL) 4 MG tablet TAKE 1/2 TABLETS (2 MG TOTAL) BY MOUTH 2 (TWO) TIMES DAILY. TAKE BEFORE MEALS 04/28/22  Yes Birdie Sons, MD  Homeopathic Products [facility] COLD REMEDY PO) Take by mouth as needed.   Yes [provider]  metFORMIN (GLUCOPHAGE) 1000 MG tablet TAKE 1 TABLET BY MOUTH TWICE A DAY 05/02/22  Yes Birdie Sons, MD  montelukast (SINGULAIR) 10 MG tablet Take 10 mg by mouth daily as needed.   Yes [provider]  pioglitazone (ACTOS) 45 MG tablet TAKE 1 TABLET BY MOUTH EVERY DAY 05/02/22  Yes Birdie Sons, MD   rosuvastatin (CRESTOR) 5 MG tablet Take 1 tablet (5 mg total) by mouth daily. 04/23/22  Yes Birdie Sons, MD  sildenafil (VIAGRA) 50 MG tablet Take 50 mg by mouth daily as needed for erectile dysfunction.   Yes [provider]  sitaGLIPtin (JANUVIA) 100 MG tablet TAKE 1 TABLET BY MOUTH EVERY DAY 05/02/22  Yes Birdie Sons, MD    Allergies as of 04/29/2022 - Review Complete 04/23/2022  Allergen Reaction Noted   Atorvastatin  04/18/2021   Pravastatin Swelling 04/29/2022    Family History  Problem Relation Age of Onset   Diabetes Mother    Dementia Mother    Hypertension Mother    Bone cancer Father        spread to liver   Diabetes Father    Healthy Brother    Healthy Brother    Ovarian cancer Maternal Grandmother    Cancer Maternal Grandfather        unknown   Heart attack Paternal Grandfather    Colon cancer Neg Hx    Prostate cancer Neg Hx     Social History   Socioeconomic History   Marital status: Married    Spouse name: Not on file   Number of children: 0   Years of education: H/S   Highest education level: 12th grade  Occupational History  Occupation: Retired  Tobacco Use   Smoking status: Former    Packs/day: 0.25    Years: 5.00    Total pack years: 1.25    Types: Cigarettes    Quit date: 06/30/1984    Years since quitting: 37.9   Smokeless tobacco: Never   Tobacco comments:    started smoking at age 48  Vaping Use   Vaping Use: Never used  Substance and Sexual Activity   Alcohol use: Not Currently   Drug use: No   Sexual activity: Not on file  Other Topics Concern   Not on file  Social History Narrative   Not on file   Social Determinants of Health   Financial Resource Strain: Low Risk  (12/25/2017)   Overall Financial Resource Strain (CARDIA)    Difficulty of Paying Living Expenses: Not hard at all  Food Insecurity: No Food Insecurity (12/25/2017)   Hunger Vital Sign    Worried About Running Out of Food in the Last Year:  Never true    [place] in the Last Year: Never true  Transportation Needs: No Transportation Needs (12/25/2017)   PRAPARE - Hydrologist (Medical): No    Lack of Transportation (Non-Medical): No  Physical Activity: Not on file  Stress: No Stress Concern Present (12/25/2017)   [place]    Feeling of Stress : Not at all  Social Connections: Not on file  Intimate Partner Violence: Not on file    Review of Systems: See HPI, otherwise negative ROS  Physical Exam: BP 135/65   Pulse (!) 59   Temp (!) 97 F (36.1 C) (Temporal)   Resp 16   Ht '5\' 11"'$  (1.803 m)   Wt 95.7 kg   SpO2 99%   BMI 29.43 kg/m  General:   Alert, cooperative in NAD Head:  Normocephalic and atraumatic. Respiratory:  Normal work of breathing. Cardiovascular:  RRR  Impression/Plan: [name] is here for cataract surgery.  Risks, benefits, limitations, and alternatives regarding cataract surgery have been reviewed with the patient.  Questions have been answered.  All parties agreeable.   Birder Robson, MD  06/11/2022, 7:19 AM

## 2022-06-11 NOTE — Op Note (Signed)
PREOPERATIVE DIAGNOSIS:  Nuclear sclerotic cataract of the left eye.   POSTOPERATIVE DIAGNOSIS:  Nuclear sclerotic cataract of the left eye.   OPERATIVE PROCEDURE: Procedure(s): CATARACT EXTRACTION PHACO AND INTRAOCULAR LENS PLACEMENT (IOC) LEFT DIABETIC CLAREON VIVITY TORIC LENS 9.95 00:58.2   SURGEON:  Birder Robson, MD.   ANESTHESIA: 1.      Managed anesthesia care. 2.     0.14m os Shugarcaine was instilled following the paracentesis [email or web address]@   COMPLICATIONS:  None.   TECHNIQUE:   Stop and chop    DESCRIPTION OF PROCEDURE:  The patient was examined and consented in the preoperative holding area where the aforementioned topical anesthesia was applied to the left eye.  The patient was brought back to the Operating Room where he was sat upright on the gurney and given a target to fixate upon while the eye was marked at the 3:00 and 9:00 position.  The patient was then reclined on the operating table.  The eye was prepped and draped in the usual sterile ophthalmic fashion and a lid speculum was placed. A paracentesis was created with the side port blade and the anterior chamber was filled with viscoelastic. A near clear corneal incision was performed with the steel keratome. A continuous curvilinear capsulorrhexis was performed with a cystotome followed by the capsulorrhexis forceps. Hydrodissection and hydrodelineation were carried out with BSS on a blunt cannula. The lens was removed in a stop and chop technique and the remaining cortical material was removed with the irrigation-aspiration handpiece. The eye was inflated with viscoelastic and the ZCT lens was placed in the eye and rotated to within a few degrees of the predetermined orientation.  The remaining viscoelastic was removed from the eye.  The Sinskey hook was used to rotate the toric lens into its final resting place at 004 degrees.  0.1 ml of Vigamox was placed in the anterior chamber. The eye was inflated to a physiologic  pressure and found to be watertight.  The eye was dressed with Vigamox and Combigan. The patient was given protective glasses to wear throughout the day and a shield with which to sleep tonight. The patient was also given drops with which to begin a drop regimen today and will follow-up with me in one day. Implant Name Type Inv. Item Serial No. Manufacturer Lot No. LRB No. Used Action  LENS CLAREON VIVITY TORIC 271- [id number] [id number]  Left 1 Implanted   Procedure(s) with comments: CATARACT EXTRACTION PHACO AND INTRAOCULAR LENS PLACEMENT (IOC) LEFT DIABETIC CLAREON VIVITY TORIC LENS 9.95 00:58.2 (Left) - Diabetic  Electronically signed: WBirder Robson12/12/20237:54 AM

## 2022-06-11 NOTE — Anesthesia Procedure Notes (Signed)
Procedure Name: MAC Date/Time: 06/11/2022 7:33 AM  Performed by: Candice Camp, CRNAPre-anesthesia Checklist: Patient identified, Emergency Drugs available, Suction available, Patient being monitored and Timeout performed Patient Re-evaluated:Patient Re-evaluated prior to induction Oxygen Delivery Method: Nasal cannula

## 2022-06-12 ENCOUNTER — Telehealth: Payer: Self-pay | Admitting: Family Medicine

## 2022-06-12 ENCOUNTER — Encounter: Payer: Self-pay | Admitting: Ophthalmology

## 2022-06-12 NOTE — Telephone Encounter (Signed)
I called patient to schedule his AWV.  Patient said he came for his last AWV and they just "chatted".  Patient said he feels it's a waste of time.  Patient said I can call him back next year.

## 2022-08-23 NOTE — Progress Notes (Unsigned)
Ar[place] P[place],acting as a scribe for Lelon Huh, MD.,have documented all relevant documentation on the behalf of Lelon Huh, MD,as directed by  Lelon Huh, MD while in the presence of Lelon Huh, MD.     Established patient visit   Patient: Mark Fitzpatrick   DOB: September 26, 1950   72 y.o. Male  MRN: [id number] Visit Date: 08/26/2022  Today's healthcare provider: Lelon Huh, MD   No chief complaint on file.  Subjective    HPI  Diabetes Mellitus Type II, Follow-up  Lab Results  Component Value Date   HGBA1C 7.3 (A) 04/23/2022   HGBA1C 7.9 (H) 07/31/2021   HGBA1C 7.8 (H) 04/18/2021   Wt Readings from Last 3 Encounters:  06/11/22 211 lb (95.7 kg)  05/28/22 211 lb 14.4 oz (96.1 kg)  04/23/22 214 lb 6.4 oz (97.3 kg)   Last seen for diabetes 4 months ago.  Management since then includes no changes. He reports {excellent/good/fair/poor:19665} compliance with treatment. He {is/is not:21021397} having side effects. {document side effects if present:1} Symptoms: {Yes/No:20286} fatigue {Yes/No:20286} foot ulcerations  {Yes/No:20286} appetite changes {Yes/No:20286} nausea  {Yes/No:20286} paresthesia of the feet  {Yes/No:20286} polydipsia  {Yes/No:20286} polyuria {Yes/No:20286} visual disturbances   {Yes/No:20286} vomiting     Home blood sugar records: {diabetes glucometry results:16657}  Episodes of hypoglycemia? {Yes/No:20286} {enter symptoms and frequency of symptoms if yes:1}   Current insulin regiment: {enter 'none' or type of insulin and number of units taken with each dose of each insulin formulation that the patient is taking:1} Most Recent Eye Exam: *** {Current exercise:16438:::1} {Current diet habits:16563:::1}  Pertinent Labs: Lab Results  Component Value Date   CHOL 162 07/31/2021   HDL 66 07/31/2021   LDLCALC 79 07/31/2021   TRIG 96 07/31/2021   CHOLHDL 2.5 07/31/2021   Lab Results  Component Value Date   NA 141 07/31/2021   K 5.5 (H)  07/31/2021   CREATININE 1.07 07/31/2021   EGFR 74 07/31/2021   LABMICR 30.8 07/31/2021   MICRALBCREAT 17 07/31/2021     ---------------------------------------------------------------------------------------------------   Medications: Outpatient Medications Prior to Visit  Medication Sig   aspirin EC 81 MG tablet Take 1 tablet (81 mg total) by mouth daily.   BIOTIN PO Take by mouth.   CINNAMON PO Take 1,000 mg by mouth 2 (two) times daily.    ELDERBERRY PO Take 1 Dose by mouth daily.   glimepiride (AMARYL) 4 MG tablet TAKE 1/2 TABLETS (2 MG TOTAL) BY MOUTH 2 (TWO) TIMES DAILY. TAKE BEFORE MEALS   Homeopathic Products (ZICAM COLD REMEDY PO) Take by mouth as needed.   metFORMIN (GLUCOPHAGE) 1000 MG tablet TAKE 1 TABLET BY MOUTH TWICE A DAY   montelukast (SINGULAIR) 10 MG tablet Take 10 mg by mouth daily as needed.   pioglitazone (ACTOS) 45 MG tablet TAKE 1 TABLET BY MOUTH EVERY DAY   rosuvastatin (CRESTOR) 5 MG tablet Take 1 tablet (5 mg total) by mouth daily.   sildenafil (VIAGRA) 50 MG tablet Take 50 mg by mouth daily as needed for erectile dysfunction.   sitaGLIPtin (JANUVIA) 100 MG tablet TAKE 1 TABLET BY MOUTH EVERY DAY   No facility-administered medications prior to visit.    Review of Systems  {Labs  Heme  Chem  Endocrine  Serology  Results Review (optional):23779}   Objective    There were no vitals taken for this visit. {Show previous vital signs (optional):23777}  Physical Exam  ***  No results found for any visits on 08/26/22.  Assessment & Plan     ***  No follow-ups on file.      {provider attestation***:1}   Lelon Huh, MD  [place] [phone] (phone) [phone] (fax)  [place]

## 2022-08-26 ENCOUNTER — Ambulatory Visit: Payer: Medicare PPO | Admitting: Family Medicine

## 2022-08-26 VITALS — BP 123/59 | HR 55 | Temp 97.9°F | Ht 71.0 in | Wt 216.0 lb

## 2022-08-26 DIAGNOSIS — E11319 Type 2 diabetes mellitus with unspecified diabetic retinopathy without macular edema: Secondary | ICD-10-CM

## 2022-08-27 ENCOUNTER — Other Ambulatory Visit: Payer: Self-pay | Admitting: Family Medicine

## 2022-08-27 LAB — COMPREHENSIVE METABOLIC PANEL
ALT: 13 IU/L (ref 0–44)
AST: 16 IU/L (ref 0–40)
Albumin/Globulin Ratio: 1.9 (ref 1.2–2.2)
Albumin: 4.5 g/dL (ref 3.8–4.8)
Alkaline Phosphatase: 55 IU/L (ref 44–121)
BUN/Creatinine Ratio: 12 (ref 10–24)
BUN: 14 mg/dL (ref 8–27)
Bilirubin Total: 0.5 mg/dL (ref 0.0–1.2)
CO2: 24 mmol/L (ref 20–29)
Calcium: 9.5 mg/dL (ref 8.6–10.2)
Chloride: 105 mmol/L (ref 96–106)
Creatinine, Ser: 1.16 mg/dL (ref 0.76–1.27)
Globulin, Total: 2.4 g/dL (ref 1.5–4.5)
Glucose: 227 mg/dL — ABNORMAL HIGH (ref 70–99)
Potassium: 5.7 mmol/L — ABNORMAL HIGH (ref 3.5–5.2)
Sodium: 142 mmol/L (ref 134–144)
Total Protein: 6.9 g/dL (ref 6.0–8.5)
eGFR: 67 mL/min/{1.73_m2} (ref >59)

## 2022-08-27 LAB — CBC
Hematocrit: 41.5 % (ref 37.5–51.0)
Hemoglobin: 13.6 g/dL (ref 13.0–17.7)
MCH: 29.7 pg (ref 26.6–33.0)
MCHC: 32.8 g/dL (ref 31.5–35.7)
MCV: 91 fL (ref 79–97)
Platelets: 232 10*3/uL (ref 150–450)
RBC: 4.58 x10E6/uL (ref 4.14–5.80)
RDW: 12.4 % (ref 11.6–15.4)
WBC: 6.5 10*3/uL (ref 3.4–10.8)

## 2022-08-27 LAB — LIPID PANEL
Chol/HDL Ratio: 1.9 ratio (ref 0.0–5.0)
Cholesterol, Total: 128 mg/dL (ref 100–199)
HDL: 67 mg/dL (ref >39)
LDL Chol Calc (NIH): 47 mg/dL (ref 0–99)
Triglycerides: 70 mg/dL (ref 0–149)
VLDL Cholesterol Cal: 14 mg/dL (ref 5–40)

## 2022-08-27 LAB — MICROALBUMIN / CREATININE URINE RATIO
Creatinine, Urine: 113 mg/dL
Microalb/Creat Ratio: 11 mg/g creat (ref 0–29)
Microalbumin, Urine: 12.2 ug/mL

## 2022-08-27 LAB — HEMOGLOBIN A1C
Est. average glucose Bld gHb Est-mCnc: 200 mg/dL
Hgb A1c MFr Bld: 8.6 % — ABNORMAL HIGH (ref 4.8–5.6)

## 2022-08-27 MED ORDER — RYBELSUS 3 MG PO TABS: 3.0000 mg | ORAL_TABLET | Freq: Every day | ORAL | 0 refills | Status: DC

## 2022-09-20 ENCOUNTER — Other Ambulatory Visit: Payer: Self-pay | Admitting: Family Medicine

## 2022-09-20 DIAGNOSIS — E11319 Type 2 diabetes mellitus with unspecified diabetic retinopathy without macular edema: Secondary | ICD-10-CM

## 2022-09-20 MED ORDER — RYBELSUS 7 MG PO TABS: 7.0000 mg | ORAL_TABLET | Freq: Every day | ORAL | 0 refills | Status: DC

## 2022-10-10 ENCOUNTER — Other Ambulatory Visit: Payer: Self-pay | Admitting: Family Medicine

## 2022-10-10 NOTE — Telephone Encounter (Signed)
Requested Prescriptions  Pending Prescriptions Disp Refills   rosuvastatin (CRESTOR) 5 MG tablet [Pharmacy Med Name: ROSUVASTATIN CALCIUM 5 MG TAB] 90 tablet 2    Sig: TAKE 1 TABLET (5 MG TOTAL) BY MOUTH DAILY.     Cardiovascular:  Antilipid - Statins 2 Failed - 10/10/2022 10:37 AM      Failed - Lipid Panel in normal range within the last 12 months    Cholesterol, Total  Date Value Ref Range Status  08/26/2022 128 100 - 199 mg/dL Final   LDL Chol Calc (NIH)  Date Value Ref Range Status  08/26/2022 47 0 - 99 mg/dL Final   HDL  Date Value Ref Range Status  08/26/2022 67 >39 mg/dL Final   Triglycerides  Date Value Ref Range Status  08/26/2022 70 0 - 149 mg/dL Final         Passed - Cr in normal range and within 360 days    Creatinine, Ser  Date Value Ref Range Status  08/26/2022 1.16 0.76 - 1.27 mg/dL Final   Creatinine, POC  Date Value Ref Range Status  12/07/2018 n/a mg/dL Final         Passed - Patient is not pregnant      Passed - Valid encounter within last 12 months    Recent Outpatient Visits           1 month ago Type 2 diabetes mellitus with retinopathy, without long-term current use of insulin, macular edema presence unspecified, unspecified laterality, unspecified retinopathy severity (HCC)   [place] [facility] Malva Limes, MD   5 months ago Type 2 diabetes mellitus with retinopathy, without long-term current use of insulin, macular edema presence unspecified, unspecified laterality, unspecified retinopathy severity (HCC)   [place] [facility] Malva Limes, MD   1 year ago Type 2 diabetes mellitus with retinopathy, without long-term current use of insulin, macular edema presence unspecified, unspecified laterality, unspecified retinopathy severity (HCC)   [place] [facility] Malva Limes, MD   1 year ago Annual physical exam   [place] [facility] Malva Limes, MD   1  year ago Type 2 diabetes mellitus with retinopathy, without long-term current use of insulin, macular edema presence unspecified, unspecified laterality, unspecified retinopathy severity (HCC)   [place] [facility] Malva Limes, MD              Refused Prescriptions Disp Refills   RYBELSUS 3 MG TABS [Pharmacy Med Name: RYBELSUS 3 MG TABLET] 30 tablet 0    Sig: TAKE 1 TABLET BY MOUTH DAILY     Off-Protocol Failed - 10/10/2022 10:37 AM      Failed - Medication not assigned to a protocol, review manually.      Passed - Valid encounter within last 12 months    Recent Outpatient Visits           1 month ago Type 2 diabetes mellitus with retinopathy, without long-term current use of insulin, macular edema presence unspecified, unspecified laterality, unspecified retinopathy severity (HCC)   [place] [facility] Malva Limes, MD   5 months ago Type 2 diabetes mellitus with retinopathy, without long-term current use of insulin, macular edema presence unspecified, unspecified laterality, unspecified retinopathy severity (HCC)   [place] [facility] Malva Limes, MD   1 year ago Type 2 diabetes mellitus with retinopathy, without long-term current use of insulin, macular edema presence unspecified, unspecified laterality,  unspecified retinopathy severity [facility])   [place] [facility] Malva Limes, MD   1 year ago Annual physical exam   [place] [facility] Malva Limes, MD   1 year ago Type 2 diabetes mellitus with retinopathy, without long-term current use of insulin, macular edema presence unspecified, unspecified laterality, unspecified retinopathy severity [facility])   [place] [facility] Malva Limes, MD

## 2023-01-06 DIAGNOSIS — Z961 Presence of intraocular lens: Secondary | ICD-10-CM | POA: Diagnosis not present

## 2023-01-06 DIAGNOSIS — E113392 Type 2 diabetes mellitus with moderate nonproliferative diabetic retinopathy without macular edema, left eye: Secondary | ICD-10-CM | POA: Diagnosis not present

## 2023-01-08 ENCOUNTER — Other Ambulatory Visit: Payer: Self-pay | Admitting: Family Medicine

## 2023-01-08 DIAGNOSIS — E11319 Type 2 diabetes mellitus with unspecified diabetic retinopathy without macular edema: Secondary | ICD-10-CM

## 2023-01-24 ENCOUNTER — Encounter: Payer: Self-pay | Admitting: Family Medicine

## 2023-01-24 ENCOUNTER — Ambulatory Visit: Payer: Medicare PPO | Admitting: Family Medicine

## 2023-01-24 ENCOUNTER — Telehealth: Payer: Self-pay | Admitting: Family Medicine

## 2023-01-24 VITALS — BP 117/66 | HR 61 | Temp 97.7°F | Ht 70.98 in | Wt 197.1 lb

## 2023-01-24 DIAGNOSIS — E11319 Type 2 diabetes mellitus with unspecified diabetic retinopathy without macular edema: Secondary | ICD-10-CM

## 2023-01-24 DIAGNOSIS — Z1211 Encounter for screening for malignant neoplasm of colon: Secondary | ICD-10-CM

## 2023-01-24 LAB — POCT GLYCOSYLATED HEMOGLOBIN (HGB A1C)
Est. average glucose Bld gHb Est-mCnc: 169
Hemoglobin A1C: 7.5 % — AB (ref 4.0–5.6)

## 2023-01-24 MED ORDER — ACCU-CHEK AVIVA PLUS W/DEVICE KIT: PACK | 0 refills | Status: DC

## 2023-01-24 MED ORDER — ACCU-CHEK AVIVA PLUS VI STRP: ORAL_STRIP | 4 refills | Status: DC

## 2023-01-24 MED ORDER — ACCU-CHEK SOFTCLIX LANCETS MISC: 4 refills | Status: DC

## 2023-01-24 NOTE — Telephone Encounter (Signed)
Pt is calling to report that the strips are obsolete for Blood Glucose Monitoring Suppl (ACCU-CHEK AVIVA PLUS) w/Device KIT [[id number]] . Requesting if another meter can be sent. Patient reports that it does not matter the type of meter as long as it is approved by his insurance. CB- [phone]

## 2023-01-24 NOTE — Progress Notes (Signed)
Established patient visit   Patient: Mark Fitzpatrick   DOB: Dec 05, 1950   72 y.o. Male  MRN: [id number] Visit Date: 01/24/2023  Today's healthcare provider: Mila Merry, MD   Chief Complaint  Patient presents with   Diabetes   Subjective    Discussed the use of AI scribe software for clinical note transcription with the patient, who gave verbal consent to proceed.  History of Present Illness   The patient, with a history of diabetes, has been on Rybelsus 7mg  without any reported side effects. He has noticed an improvement in his blood sugar levels, with a recent reading of 7.5, down from 8.6 in February. The patient has also lost close to 19 pounds since starting Rybelsus  He recently had cataract surgery in both eyes, with the right eye achieving 20/20 vision and the left eye 20/25. The patient follows up with his ophthalmologist annually.  In terms of lifestyle, the patient is quite active, walking between four to eight miles daily. He has been disciplined with his diet, avoiding sweets.       Medications: Outpatient Medications Prior to Visit  Medication Sig   aspirin EC 81 MG tablet Take 1 tablet (81 mg total) by mouth daily.   BIOTIN PO Take by mouth.   CINNAMON PO Take 1,000 mg by mouth 2 (two) times daily.    ELDERBERRY PO Take 1 Dose by mouth daily.   glimepiride (AMARYL) 4 MG tablet TAKE 1/2 TABLETS (2 MG TOTAL) BY MOUTH 2 (TWO) TIMES DAILY. TAKE BEFORE MEALS   Homeopathic Products (ZICAM COLD REMEDY PO) Take by mouth as needed.   metFORMIN (GLUCOPHAGE) 1000 MG tablet TAKE 1 TABLET BY MOUTH TWICE A DAY   montelukast (SINGULAIR) 10 MG tablet Take 10 mg by mouth daily as needed.   pioglitazone (ACTOS) 45 MG tablet TAKE 1 TABLET BY MOUTH EVERY DAY   rosuvastatin (CRESTOR) 5 MG tablet TAKE 1 TABLET (5 MG TOTAL) BY MOUTH DAILY.   RYBELSUS 7 MG TABS TAKE 1 TABLET (7 MG TOTAL) BY MOUTH DAILY   sildenafil (VIAGRA) 50 MG tablet Take 50 mg by mouth daily as needed for  erectile dysfunction.   No facility-administered medications prior to visit.   Review of Systems  Constitutional:  Negative for appetite change, chills and fever.  Respiratory:  Negative for chest tightness, shortness of breath and wheezing.   Cardiovascular:  Negative for chest pain and palpitations.  Gastrointestinal:  Negative for abdominal pain, nausea and vomiting.      Objective    BP 117/66   Pulse 61   Temp 97.7 F (36.5 C) (Oral)   Ht 5' 10.98" (1.803 m)   Wt 197 lb 1.6 oz (89.4 kg)   SpO2 99%   BMI 27.50 kg/m         Results for orders placed or performed in visit on 01/24/23  POCT HgB A1C  Result Value Ref Range   Hemoglobin A1C 7.5 (A) 4.0 - 5.6 %   Est. average glucose Bld gHb Est-mCnc 169     Assessment & Plan     Assessment and Plan    Type 2 Diabetes Mellitus: Improved glycemic control with A1C of 7.5, down from 8.6 in February prior to starting Rybelsus. Patient is currently on Rybelsus 7mg  daily with no reported side effects. Noted weight loss of approximately 19 pounds. -Continue all current medications -Consider increasing Rybelsus to 14mg  daily if A1C does not continue to decrease. -Check A1C at  next visit in late fall.  Diabetic Retinopathy: No current evidence of retinopathy per recent eye exam by Dr. Brandon Melnick. Patient had successful cataract surgery in December and January. -Continue annual eye exams with Dr. Druscilla Brownie.  Colon Cancer Screening: Last Cologuard test in August 2021. Patient due for repeat screening. -Order Cologuard test in August 2024.  Blood Glucose Monitoring: Patient's current meter is broken, impacting ability to monitor blood glucose levels at home. -Send prescription for new glucose meter to pharmacy.  Follow-up: Schedule physical exam in late fall (October/November) to reassess A1C and overall health status.     Return in about 4 months (around 05/27/2023) for Annual Wellness Visit, Yearly Physical.        Mila Merry, MD  [facility] [phone] (phone) [phone] (fax)  [facility]

## 2023-01-24 NOTE — Patient Instructions (Signed)
.   Please review the attached list of medications and notify my office if there are any errors.   . Please bring all of your medications to every appointment so we can make sure that our medication list is the same as yours.   

## 2023-01-29 ENCOUNTER — Telehealth: Payer: Self-pay | Admitting: Family Medicine

## 2023-01-29 DIAGNOSIS — E11319 Type 2 diabetes mellitus with unspecified diabetic retinopathy without macular edema: Secondary | ICD-10-CM

## 2023-01-29 MED ORDER — ONETOUCH ULTRA VI STRP: ORAL_STRIP | 4 refills | Status: DC

## 2023-01-29 MED ORDER — ONETOUCH ULTRA 2 W/DEVICE KIT: PACK | 0 refills | Status: DC

## 2023-01-29 NOTE — Telephone Encounter (Signed)
Covermymeds is requesting prior authorization Key: [id number] Name: [employer]zel OneTouch Ultra 2 w/Device Kit has been rejected and requires PA

## 2023-02-05 MED ORDER — ACCU-CHEK AVIVA PLUS W/DEVICE KIT: PACK | 0 refills | Status: DC

## 2023-02-05 MED ORDER — ACCU-CHEK SOFTCLIX LANCETS MISC: 4 refills | Status: AC

## 2023-02-05 MED ORDER — ACCU-CHEK AVIVA PLUS VI STRP: ORAL_STRIP | 4 refills | Status: DC

## 2023-02-10 ENCOUNTER — Telehealth: Payer: Self-pay | Admitting: Family Medicine

## 2023-02-10 NOTE — Telephone Encounter (Signed)
Medication Refill - Medication: generic test strips  Pt stated Dr. Sherrie Mustache sent in a new meter for him, Blood Glucose Monitoring Suppl (ACCU-CHEK AVIVA PLUS) w/Device KIT. However, the test strips he sent don't work with the new meter. Pt stated the pharmacy advised him to request new Rx for generic test strips; this way they would give him the correct ones.  Has the patient contacted their pharmacy? Yes.    (Agent: If yes, when and what did the pharmacy advise?)  Preferred Pharmacy (with phone number or street name):  [id number] - [place], [place] - [address]  [address] [place] [address]  Phone: [phone] Fax: [phone]  Hours: Not open 24 hours   Has the patient been seen for an appointment in the last year OR does the patient have an upcoming appointment? Yes.    Agent: Please be advised that RX refills may take up to 3 business days. We ask that you follow-up with your pharmacy.

## 2023-02-11 MED ORDER — BLOOD GLUCOSE MONITORING SUPPL DEVI: 1.0000 | Freq: Three times a day (TID) | 0 refills | Status: AC

## 2023-02-11 MED ORDER — BLOOD GLUCOSE TEST VI STRP: 1.0000 | ORAL_STRIP | Freq: Three times a day (TID) | 0 refills | Status: AC

## 2023-03-11 DIAGNOSIS — Z1211 Encounter for screening for malignant neoplasm of colon: Secondary | ICD-10-CM | POA: Diagnosis not present

## 2023-04-04 ENCOUNTER — Other Ambulatory Visit: Payer: Self-pay | Admitting: Family Medicine

## 2023-04-04 DIAGNOSIS — E11319 Type 2 diabetes mellitus with unspecified diabetic retinopathy without macular edema: Secondary | ICD-10-CM

## 2023-04-23 ENCOUNTER — Telehealth: Payer: Self-pay | Admitting: Family Medicine

## 2023-04-23 NOTE — Telephone Encounter (Signed)
PA initiated

## 2023-04-23 NOTE — Telephone Encounter (Signed)
Covermymeds is requesting PA Key: [id number] OneTouch Ultra Strips has been rejected and requires PA

## 2023-04-24 NOTE — Telephone Encounter (Signed)
Outcome Denied today by [facility] NCPDP 2017 We cover this item when our criteria are met. The unmet criteria are: a non-preferred meter and test strips for self-monitoring of blood sugar (i.e. fingerstick testing) is said to be medically necessary for the member by a healthcare provider. [employer]s preferred meters and test strips include: meters and test strips marketed by Roche (for example: Accu-Chek Aviva Plus, Accu-Chek Guide, Accu-Chek Guide Me) or [employer] (for example: TrueMetrix, TrueTrack). This decision was from [employer]s Diabetic Test Strips and Meters Pharmacy Coverage Policy at [email or web address] [facility] OneTouch Ultra strips

## 2023-04-28 NOTE — Telephone Encounter (Signed)
The last prescription was accucheck, not one touch. Why ware we getting a PA request for onetouch?

## 2023-04-29 NOTE — Telephone Encounter (Signed)
Per pharmacist not sure how that happened. Per pharmacist they already ordered the Accu check Aviva plus for the patient.

## 2023-05-07 ENCOUNTER — Ambulatory Visit: Payer: Medicare PPO | Admitting: Family Medicine

## 2023-05-07 VITALS — BP 131/58 | HR 66 | Temp 98.1°F | Ht 70.0 in | Wt 190.0 lb

## 2023-05-07 DIAGNOSIS — Z0000 Encounter for general adult medical examination without abnormal findings: Secondary | ICD-10-CM

## 2023-05-07 DIAGNOSIS — Z0001 Encounter for general adult medical examination with abnormal findings: Secondary | ICD-10-CM | POA: Diagnosis not present

## 2023-05-07 DIAGNOSIS — Z23 Encounter for immunization: Secondary | ICD-10-CM | POA: Diagnosis not present

## 2023-05-07 DIAGNOSIS — B351 Tinea unguium: Secondary | ICD-10-CM | POA: Diagnosis not present

## 2023-05-07 DIAGNOSIS — Z125 Encounter for screening for malignant neoplasm of prostate: Secondary | ICD-10-CM | POA: Diagnosis not present

## 2023-05-07 DIAGNOSIS — I491 Atrial premature depolarization: Secondary | ICD-10-CM

## 2023-05-07 DIAGNOSIS — E11319 Type 2 diabetes mellitus with unspecified diabetic retinopathy without macular edema: Secondary | ICD-10-CM | POA: Diagnosis not present

## 2023-05-07 DIAGNOSIS — Z7984 Long term (current) use of oral hypoglycemic drugs: Secondary | ICD-10-CM | POA: Diagnosis not present

## 2023-05-07 MED ORDER — TERBINAFINE HCL 250 MG PO TABS: 250.0000 mg | ORAL_TABLET | Freq: Every day | ORAL | 2 refills | Status: DC

## 2023-05-07 NOTE — Progress Notes (Signed)
Complete physical exam   Patient: Mark Fitzpatrick   DOB: 10/29/1950   72 y.o. Male  MRN: [id number] Visit Date: 05/07/2023  Today's healthcare provider: Mila Merry, MD   Chief Complaint  Patient presents with   Annual Exam   Hypertension   Hyperlipidemia   Diabetes   Subjective    Discussed the use of AI scribe software for clinical note transcription with the patient, who gave verbal consent to proceed.  History of Present Illness   The patient presents for a routine physical examination and reports a recurrence of a previously treated toenail fungal infection treated about 2 years ago. He notes that the infection had initially cleared up but has recently returned. The patient is unsure if the infection had ever fully resolved.  he is also here to follow up on diabetes, hypertension, and hyperlipidemia and feels like current medications are working very well. Home sugars are mostly in the low 100s.   The patient also mentions a recent cataract surgery, which has improved his vision significantly. He has not experienced any issues with his hearing or ringing in his ears.         Past Medical History:  Diagnosis Date   Bradycardia    Bursitis of shoulder    Diabetes mellitus without complication (HCC)    Erectile dysfunction    Heart murmur    as child   History of broken nose    Hx of dysplastic nevus 07/13/2012   L lat infrapectoral costal area, moderate   Shoulder pain    Past Surgical History:  Procedure Laterality Date   CATARACT EXTRACTION W/PHACO Right 05/28/2022   Procedure: CATARACT EXTRACTION PHACO AND INTRAOCULAR LENS PLACEMENT (IOC) RIGHT DIABETIC CLAREON VIVITY TORIC LENS  9.61  00:53.8;  Surgeon: Galen Manila, MD;  Location: [facility];  Service: Ophthalmology;  Laterality: Right;   CATARACT EXTRACTION W/PHACO Left 06/11/2022   Procedure: CATARACT EXTRACTION PHACO AND INTRAOCULAR LENS PLACEMENT (IOC) LEFT DIABETIC CLAREON VIVITY  TORIC LENS 9.95 00:58.2;  Surgeon: Galen Manila, MD;  Location: [facility];  Service: Ophthalmology;  Laterality: Left;  Diabetic   collar bone fracture     COLONOSCOPY     RECONSTRUCTION OF NOSE  1996   Social History   Socioeconomic History   Marital status: Married    Spouse name: Not on file   Number of children: 0   Years of education: H/S   Highest education level: 12th grade  Occupational History   Occupation: Retired  Tobacco Use   Smoking status: Former    Current packs/day: 0.00    Average packs/day: 0.3 packs/day for 5.0 years (1.3 ttl pk-yrs)    Types: Cigarettes    Start date: 07/01/1979    Quit date: 06/30/1984    Years since quitting: 38.8   Smokeless tobacco: Never   Tobacco comments:    started smoking at age 64  Vaping Use   Vaping status: Never Used  Substance and Sexual Activity   Alcohol use: Not Currently   Drug use: No   Sexual activity: Not on file  Other Topics Concern   Not on file  Social History Narrative   Not on file   Social Determinants of Health   Financial Resource Strain: Low Risk  (01/23/2023)   Overall Financial Resource Strain (CARDIA)    Difficulty of Paying Living Expenses: Not hard at all  Food Insecurity: No Food Insecurity (01/23/2023)   Hunger Vital Sign  Worried About Programme researcher, broadcasting/film/video in the Last Year: Never true    Ran Out of Food in the Last Year: Never true  Transportation Needs: No Transportation Needs (01/23/2023)   PRAPARE - Administrator, Civil Service (Medical): No    Lack of Transportation (Non-Medical): No  Physical Activity: Sufficiently Active (01/23/2023)   Exercise Vital Sign    Days of Exercise per Week: 7 days    Minutes of Exercise per Session: 50 min  Stress: No Stress Concern Present (01/23/2023)   [employer] of Occupational Health - Occupational Stress Questionnaire    Feeling of Stress : Not at all  Social Connections: Socially Integrated (01/23/2023)   Social  Connection and Isolation Panel [NHANES]    Frequency of Communication with Friends and Family: Twice a week    Frequency of Social Gatherings with Friends and Family: Once a week    Attends Religious Services: More than 4 times per year    Active Member of [employer] or Organizations: Yes    Attends Engineer, structural: More than 4 times per year    Marital Status: Married  Catering manager Violence: Not on file   Family Status  Relation Name Status   Mother  Alive   Father  Deceased at age 52   Brother  Alive   Brother  Alive   MGM  Deceased   MGF  Deceased at age 6s   PGM  Deceased at age 5   PGF  Deceased at age 30s       MI   Neg Hx  (Not Specified)  No partnership data on file   Family History  Problem Relation Age of Onset   Diabetes Mother    Dementia Mother    Hypertension Mother    Bone cancer Father        spread to liver   Diabetes Father    Healthy Brother    Healthy Brother    Ovarian cancer Maternal Grandmother    Cancer Maternal Grandfather        unknown   Heart attack Paternal Grandfather    Colon cancer Neg Hx    Prostate cancer Neg Hx    Allergies  Allergen Reactions   Atorvastatin     Muscle pains   Pravastatin Swelling    Swelling of joints    Patient Care Team: Malva Limes, MD as PCP - General (Family Medicine) Galen Manila, MD as Referring Physician (Ophthalmology)   Medications: Outpatient Medications Prior to Visit  Medication Sig   Accu-Chek Softclix Lancets lancets Use to check blood sugar daily for type 2 diabetes. E11.9   aspirin EC 81 MG tablet Take 1 tablet (81 mg total) by mouth daily.   BIOTIN PO Take by mouth.   Blood Glucose Monitoring Suppl DEVI 1 each by Does not apply route in the morning, at noon, and at bedtime. May substitute to any manufacturer covered by patient's insurance.   CINNAMON PO Take 1,000 mg by mouth 2 (two) times daily.    ELDERBERRY PO Take 1 Dose by mouth daily.   glimepiride (AMARYL)  4 MG tablet TAKE 1/2 TABLETS (2 MG TOTAL) BY MOUTH 2 (TWO) TIMES DAILY. TAKE BEFORE MEALS   Homeopathic Products (ZICAM COLD REMEDY PO) Take by mouth as needed.   metFORMIN (GLUCOPHAGE) 1000 MG tablet TAKE 1 TABLET BY MOUTH TWICE A DAY   montelukast (SINGULAIR) 10 MG tablet Take 10 mg by mouth daily as needed.  pioglitazone (ACTOS) 45 MG tablet TAKE 1 TABLET BY MOUTH EVERY DAY   rosuvastatin (CRESTOR) 5 MG tablet TAKE 1 TABLET (5 MG TOTAL) BY MOUTH DAILY.   RYBELSUS 7 MG TABS TAKE 1 TABLET (7 MG TOTAL) BY MOUTH DAILY   sildenafil (VIAGRA) 50 MG tablet Take 50 mg by mouth daily as needed for erectile dysfunction.   Blood Glucose Monitoring Suppl (ACCU-CHEK AVIVA PLUS) w/Device KIT Use to check blood sugar daily for type 2 diabetes. E11.9   glucose blood (ACCU-CHEK AVIVA PLUS) test strip Use to check blood sugar once a day   No facility-administered medications prior to visit.    Review of Systems    Objective    BP (!) 131/58 (BP Location: Left Arm, Patient Position: Sitting, Cuff Size: Normal)   Pulse 66   Temp 98.1 F (36.7 C) (Oral)   Ht 5\' 10"  (1.778 m)   Wt 190 lb (86.2 kg)   SpO2 100%   BMI 27.26 kg/m    Physical Exam  General Appearance:    Well developed, well nourished male. Alert, cooperative, in no acute distress, appears stated age  Head:    Normocephalic, without obvious abnormality, atraumatic  Eyes:    PERRL, conjunctiva/corneas clear, EOM's intact, fundi    benign, both eyes       Ears:    Normal TM's and external ear canals, both ears  Nose:   Nares normal, septum midline, mucosa normal, no drainage   or sinus tenderness  Throat:   Lips, mucosa, and tongue normal; teeth and gums normal  Neck:   Supple, symmetrical, trachea midline, no adenopathy;       thyroid:  No enlargement/tenderness/nodules; no carotid   bruit or JVD  Back:     Symmetric, no curvature, ROM normal, no CVA tenderness  Lungs:     Clear to auscultation bilaterally, respirations unlabored   Chest wall:    No tenderness or deformity  Heart:    Normal heart rate. Normal rhythm. No murmurs, rubs, or gallops.  S1 and S2 normal  Abdomen:     Soft, non-tender, bowel sounds active all four quadrants,    no masses, no organomegaly  Genitalia:    deferred  Rectal:    deferred  Extremities:   All extremities are intact. No cyanosis or edema  Pulses:   2+ and symmetric all extremities  Skin:   Skin color, texture, turgor normal, no rashes or lesions  Lymph nodes:   Cervical, supraclavicular, and axillary nodes normal  Neurologic:   CNII-XII intact. Normal strength, sensation and reflexes      throughout        Last depression screening scores    05/07/2023    9:10 AM 08/26/2022   11:05 AM 07/31/2021   11:09 AM  PHQ 2/9 Scores  PHQ - 2 Score 0 0 0  PHQ- 9 Score 0 0 0   Last fall risk screening    05/07/2023    9:10 AM  Fall Risk   Falls in the past year? 0  Number falls in past yr: 0  Injury with Fall? 0   Last Audit-C alcohol use screening    05/07/2023    9:11 AM  Alcohol Use Disorder Test (AUDIT)  1. How often do you have a drink containing alcohol? 1  2. How many drinks containing alcohol do you have on a typical day when you are drinking? 0  3. How often do you have six or more drinks on  one occasion? 0  AUDIT-C Score 1   A score of 3 or more in women, and 4 or more in men indicates increased risk for alcohol abuse, EXCEPT if all of the points are from question 1   No results found for any visits on 05/07/23.  Assessment & Plan    Routine Health Maintenance and Physical Exam  Exercise Activities and Dietary recommendations  Goals      DIET - INCREASE WATER INTAKE     Recommend increasing water intake to 4-6 glasses a day.          Immunization History  Administered Date(s) Administered   Fluad Quad(high Dose 65+) 04/14/2019, 04/18/2021, 04/23/2022   Fluad Trivalent(High Dose 65+) 05/07/2023   Influenza Split 03/12/2012   Influenza, High Dose  Seasonal PF 04/21/2015, 03/20/2016, 05/15/2017, 05/13/2018   Influenza,inj,Quad PF,6+ Mos 04/14/2013, 04/25/2014   Influenza-Unspecified 04/07/2020   PFIZER(Purple Top)SARS-COV-2 Vaccination 08/09/2019, 09/03/2019, 04/10/2020   Pneumococcal Conjugate-13 04/21/2015   Pneumococcal Polysaccharide-23 05/23/2010, 10/21/2016   Tdap 12/25/2006, 12/27/2013   Zoster Recombinant(Shingrix) 02/05/2018, 08/04/2018   Zoster, Live 12/27/2013    Health Maintenance  Topic Date Due   COVID-19 Vaccine (4 - 2023-24 season) 03/02/2023   OPHTHALMOLOGY EXAM  04/25/2023   HEMOGLOBIN A1C  07/27/2023   Diabetic kidney evaluation - eGFR measurement  08/27/2023   Diabetic kidney evaluation - Urine ACR  08/27/2023   DTaP/Tdap/Td (3 - Td or Tdap) 12/28/2023   Medicare Annual Wellness (AWV)  05/06/2024   Fecal DNA (Cologuard)  03/10/2026   Pneumonia Vaccine 7+ Years old  Completed   INFLUENZA VACCINE  Completed   Hepatitis C Screening  Completed   Zoster Vaccines- Shingrix  Completed   HPV VACCINES  Aged Out    Discussed health benefits of physical activity, and encouraged him to engage in regular exercise appropriate for his age and condition.     Onychomycosis Recurrence of fungal infection in toenails after previous successful treatment with oral terbinafine. - Prescribe oral terbinafine for two months. - Advise use of over-the-counter antifungal cream once or twice a week after completion of oral treatment to prevent recurrence.  Type 2 Diabetes Well-controlled on Rybelsus, pioglitazone, glyceride, and metformin.  - Continue current medication. - consider weaning glimepiride if a1c is very well controlled.   Hyperlipidemia On rosuvastatin, no issues reported. - Continue current medication.           Mila Merry, MD  [facility] [phone] (phone) [phone] (fax)  [facility]

## 2023-05-08 LAB — COMPREHENSIVE METABOLIC PANEL
ALT: 15 [IU]/L (ref 0–44)
AST: 15 [IU]/L (ref 0–40)
Albumin: 4.6 g/dL (ref 3.8–4.8)
Alkaline Phosphatase: 52 [IU]/L (ref 44–121)
BUN/Creatinine Ratio: 13 (ref 10–24)
BUN: 14 mg/dL (ref 8–27)
Bilirubin Total: 0.5 mg/dL (ref 0.0–1.2)
CO2: 24 mmol/L (ref 20–29)
Calcium: 9.8 mg/dL (ref 8.6–10.2)
Chloride: 103 mmol/L (ref 96–106)
Creatinine, Ser: 1.04 mg/dL (ref 0.76–1.27)
Globulin, Total: 2.3 g/dL (ref 1.5–4.5)
Glucose: 144 mg/dL — ABNORMAL HIGH (ref 70–99)
Potassium: 4.6 mmol/L (ref 3.5–5.2)
Sodium: 142 mmol/L (ref 134–144)
Total Protein: 6.9 g/dL (ref 6.0–8.5)
eGFR: 76 mL/min/{1.73_m2} (ref >59)

## 2023-05-08 LAB — LIPID PANEL
Chol/HDL Ratio: 1.9 ratio (ref 0.0–5.0)
Cholesterol, Total: 125 mg/dL (ref 100–199)
HDL: 67 mg/dL (ref >39)
LDL Chol Calc (NIH): 45 mg/dL (ref 0–99)
Triglycerides: 62 mg/dL (ref 0–149)
VLDL Cholesterol Cal: 13 mg/dL (ref 5–40)

## 2023-05-08 LAB — CBC
Hematocrit: 43.5 % (ref 37.5–51.0)
Hemoglobin: 14.3 g/dL (ref 13.0–17.7)
MCH: 30.2 pg (ref 26.6–33.0)
MCHC: 32.9 g/dL (ref 31.5–35.7)
MCV: 92 fL (ref 79–97)
Platelets: 282 10*3/uL (ref 150–450)
RBC: 4.74 x10E6/uL (ref 4.14–5.80)
RDW: 12.4 % (ref 11.6–15.4)
WBC: 6.8 10*3/uL (ref 3.4–10.8)

## 2023-05-08 LAB — HEMOGLOBIN A1C
Est. average glucose Bld gHb Est-mCnc: 160 mg/dL
Hgb A1c MFr Bld: 7.2 % — ABNORMAL HIGH (ref 4.8–5.6)

## 2023-05-08 LAB — PSA TOTAL (REFLEX TO FREE): Prostate Specific Ag, Serum: 2.2 ng/mL (ref 0.0–4.0)

## 2023-05-15 ENCOUNTER — Ambulatory Visit: Payer: Medicare PPO | Admitting: Dermatology

## 2023-05-15 DIAGNOSIS — D1801 Hemangioma of skin and subcutaneous tissue: Secondary | ICD-10-CM

## 2023-05-15 DIAGNOSIS — W908XXA Exposure to other nonionizing radiation, initial encounter: Secondary | ICD-10-CM | POA: Diagnosis not present

## 2023-05-15 DIAGNOSIS — Z1283 Encounter for screening for malignant neoplasm of skin: Secondary | ICD-10-CM | POA: Diagnosis not present

## 2023-05-15 DIAGNOSIS — L578 Other skin changes due to chronic exposure to nonionizing radiation: Secondary | ICD-10-CM | POA: Diagnosis not present

## 2023-05-15 DIAGNOSIS — L821 Other seborrheic keratosis: Secondary | ICD-10-CM

## 2023-05-15 DIAGNOSIS — D239 Other benign neoplasm of skin, unspecified: Secondary | ICD-10-CM

## 2023-05-15 DIAGNOSIS — D2262 Melanocytic nevi of left upper limb, including shoulder: Secondary | ICD-10-CM | POA: Diagnosis not present

## 2023-05-15 DIAGNOSIS — D492 Neoplasm of unspecified behavior of bone, soft tissue, and skin: Secondary | ICD-10-CM

## 2023-05-15 DIAGNOSIS — D229 Melanocytic nevi, unspecified: Secondary | ICD-10-CM

## 2023-05-15 DIAGNOSIS — L814 Other melanin hyperpigmentation: Secondary | ICD-10-CM

## 2023-05-15 DIAGNOSIS — Z86018 Personal history of other benign neoplasm: Secondary | ICD-10-CM

## 2023-05-15 DIAGNOSIS — L603 Nail dystrophy: Secondary | ICD-10-CM

## 2023-05-15 HISTORY — DX: Other benign neoplasm of skin, unspecified: D23.9

## 2023-05-15 NOTE — Progress Notes (Signed)
Follow-Up Visit   Subjective  Mark Fitzpatrick is a 72 y.o. male who presents for the following: Skin Cancer Screening and Full Body Skin Exam  The patient presents for Total-Body Skin Exam (TBSE) for skin cancer screening and mole check. The patient has spots, moles and lesions to be evaluated, some may be new or changing and the patient may have concern these could be cancer.    The following portions of the chart were reviewed this encounter and updated as appropriate: medications, allergies, medical history  Review of Systems:  No other skin or systemic complaints except as noted in HPI or Assessment and Plan.  Objective  Well appearing patient in no apparent distress; mood and affect are within normal limits.  A full examination was performed including scalp, head, eyes, ears, nose, lips, neck, chest, axillae, abdomen, back, buttocks, bilateral upper extremities, bilateral lower extremities, hands, feet, fingers, toes, fingernails, and toenails. All findings within normal limits unless otherwise noted below.   Relevant physical exam findings are noted in the Assessment and Plan.  L medial bicep 0.6cm dark macule         Assessment & Plan   SKIN CANCER SCREENING PERFORMED TODAY.  ACTINIC DAMAGE - Chronic condition, secondary to cumulative UV/sun exposure - diffuse scaly erythematous macules with underlying dyspigmentation - Recommend daily broad spectrum sunscreen SPF 30+ to sun-exposed areas, reapply every 2 hours as needed.  - Staying in the shade or wearing long sleeves, sun glasses (UVA+UVB protection) and wide brim hats (4-inch brim around the entire circumference of the hat) are also recommended for sun protection.  - Call for new or changing lesions.  LENTIGINES, SEBORRHEIC KERATOSES, HEMANGIOMAS - Benign normal skin lesions - Benign-appearing - Call for any changes  MELANOCYTIC NEVI - Tan-brown and/or pink-flesh-colored symmetric macules and papules -  Benign appearing on exam today - Observation - Call clinic for new or changing moles - Recommend daily use of broad spectrum spf 30+ sunscreen to sun-exposed areas.   HISTORY OF DYSPLASTIC NEVUS No evidence of recurrence today Recommend regular full body skin exams Recommend daily broad spectrum sunscreen SPF 30+ to sun-exposed areas, reapply every 2 hours as needed.  Call if any new or changing lesions are noted between office visits  - L lat infra pectoral costal area  NAIL PROBLEM Secondary to trauma Exam: dystrophic nails  Treatment Plan: Benign appearing, observe   Neoplasm of skin L medial bicep  Epidermal / dermal shaving  Lesion diameter (cm):  0.6 Informed consent: discussed and consent obtained   Timeout: patient name, date of birth, surgical site, and procedure verified   Procedure prep:  Patient was prepped and draped in usual sterile fashion Prep type:  Isopropyl alcohol Anesthesia: the lesion was anesthetized in a standard fashion   Anesthetic:  1% lidocaine w/ epinephrine 1-100,000 buffered w/ 8.4% NaHCO3 Instrument used: flexible razor blade   Hemostasis achieved with: pressure, aluminum chloride and electrodesiccation   Outcome: patient tolerated procedure well   Post-procedure details: sterile dressing applied and wound care instructions given   Dressing type: bandage and petrolatum    Specimen 1 - Surgical pathology Differential Diagnosis: D48.5 Nevus vs Dysplastic Nevus  Check Margins: yes 0.6cm dark macule   Return in about 2 years (around 05/14/2025) for TBSE, Hx of Dysplastic nevi.  I, Ardis Rowan, RMA, am acting as scribe for Armida Sans, MD .   Documentation: I have reviewed the above documentation for accuracy and completeness, and I agree with the  above.  Armida Sans, MD

## 2023-05-15 NOTE — Patient Instructions (Addendum)

## 2023-05-16 LAB — SURGICAL PATHOLOGY

## 2023-05-19 ENCOUNTER — Telehealth: Payer: Self-pay

## 2023-05-19 NOTE — Telephone Encounter (Signed)
-----   Message from Mark Fitzpatrick sent at 05/17/2023  4:07 PM EST ----- FINAL DIAGNOSIS        1. Skin, L medial bicep :       DYSPLASTIC COMPOUND NEVUS WITH MODERATE ATYPIA    Moderate dysplastic Recheck next visit

## 2023-05-19 NOTE — Telephone Encounter (Signed)
Advised pt of bx result/sh ?

## 2023-05-24 ENCOUNTER — Encounter: Payer: Self-pay | Admitting: Dermatology

## 2023-07-03 ENCOUNTER — Other Ambulatory Visit: Payer: Self-pay | Admitting: Family Medicine

## 2023-07-03 DIAGNOSIS — E11319 Type 2 diabetes mellitus with unspecified diabetic retinopathy without macular edema: Secondary | ICD-10-CM

## 2023-07-03 MED ORDER — RYBELSUS 7 MG PO TABS: 7.0000 mg | ORAL_TABLET | Freq: Every day | ORAL | 3 refills | Status: DC

## 2023-07-09 ENCOUNTER — Other Ambulatory Visit: Payer: Self-pay | Admitting: Family Medicine

## 2023-07-23 ENCOUNTER — Telehealth: Payer: Self-pay

## 2023-07-23 NOTE — Telephone Encounter (Signed)
Copied from CRM [id number]. Topic: General - Other >> Jul 22, 2023  2:47 PM [place] B wrote: Reason for CRM: pt;s wife called in about copay charged on 1/06 apppt with physical, spoke with Maralyn Sago she is waiting on billing ot see about getting this credited. Please cb when you have an update

## 2023-07-25 ENCOUNTER — Other Ambulatory Visit: Payer: Self-pay | Admitting: Family Medicine

## 2023-07-25 DIAGNOSIS — E11319 Type 2 diabetes mellitus with unspecified diabetic retinopathy without macular edema: Secondary | ICD-10-CM

## 2023-08-01 ENCOUNTER — Other Ambulatory Visit: Payer: Self-pay | Admitting: Family Medicine

## 2023-08-01 DIAGNOSIS — E119 Type 2 diabetes mellitus without complications: Secondary | ICD-10-CM

## 2023-08-20 NOTE — Telephone Encounter (Signed)
 Pt was billed for a CPE and OV for DOS 05/06/24.  The documentation supports these charges.

## 2023-09-02 ENCOUNTER — Telehealth: Payer: Self-pay | Admitting: Family Medicine

## 2023-09-02 NOTE — Telephone Encounter (Signed)
 Copied from CRM [id number]. Topic: General - Billing Inquiry >> Sep 01, 2023 11:29 AM Maree Krabbe H wrote: Reason for CRM: Patient is calling about her husbands bill, patients wife states that it was a $20 copay and it shouldn't, she contacted billing already and they told her to call because it would have to come from clinic to remove the charge and the nurse or the provider would have to recode it so it can be removed from her statement.

## 2023-09-02 NOTE — Telephone Encounter (Signed)
 There is already a telephone encounter concerning this.  It did not give me the option to add this to it.  FYI

## 2023-11-04 ENCOUNTER — Ambulatory Visit: Payer: Self-pay | Admitting: Family Medicine

## 2023-11-04 VITALS — BP 119/67 | HR 58 | Resp 16 | Ht 71.0 in | Wt 193.5 lb

## 2023-11-04 DIAGNOSIS — E11319 Type 2 diabetes mellitus with unspecified diabetic retinopathy without macular edema: Secondary | ICD-10-CM

## 2023-11-04 DIAGNOSIS — M7551 Bursitis of right shoulder: Secondary | ICD-10-CM

## 2023-11-04 DIAGNOSIS — B351 Tinea unguium: Secondary | ICD-10-CM

## 2023-11-04 LAB — POCT GLYCOSYLATED HEMOGLOBIN (HGB A1C)
Est. average glucose Bld gHb Est-mCnc: 143
Hemoglobin A1C: 6.6 % — AB (ref 4.0–5.6)

## 2023-11-04 MED ORDER — GLIMEPIRIDE 4 MG PO TABS: ORAL_TABLET | ORAL | Status: AC

## 2023-11-04 MED ORDER — TERBINAFINE HCL 250 MG PO TABS: 250.0000 mg | ORAL_TABLET | Freq: Every day | ORAL | 3 refills | Status: DC

## 2023-11-04 NOTE — Progress Notes (Signed)
 Established patient visit   Patient: Mark Fitzpatrick   DOB: 02-Nov-1950   73 y.o. Male  MRN: [id number] Visit Date: 11/04/2023  Today's healthcare provider: Jeralene Mom, MD   Chief Complaint  Patient presents with   Medical Management of Chronic Issues    T2DM   Subjective    HPI Follow up diabetes. Feels well. Occasional lows in very early morning. Otherwise sugars good. Feels well. C/o left shoulder pain a few weeks. Golf's several days a week.   Lab Results  Component Value Date   HGBA1C 6.6 (A) 11/04/2023   HGBA1C 7.2 (H) 05/07/2023   HGBA1C 7.5 (A) 01/24/2023   Lab Results  Component Value Date   NA 142 05/07/2023   K 4.6 05/07/2023   CREATININE 1.04 05/07/2023   EGFR 76 05/07/2023   GLUCOSE 144 (H) 05/07/2023   Also reports oral terbinifine has been effective, but finished prescription and still has a little residual discoloration on a couple of his toes. He would like to start back on terbinafine .    Medications: Outpatient Medications Prior to Visit  Medication Sig   Accu-Chek Softclix Lancets lancets Use to check blood sugar daily for type 2 diabetes. E11.9   aspirin  EC 81 MG tablet Take 1 tablet (81 mg total) by mouth daily.   BIOTIN PO Take by mouth.   Blood Glucose Monitoring Suppl DEVI 1 each by Does not apply route in the morning, at noon, and at bedtime. May substitute to any manufacturer covered by patient's insurance.   CINNAMON PO Take 1,000 mg by mouth 2 (two) times daily.    ELDERBERRY PO Take 1 Dose by mouth daily.   glimepiride  (AMARYL ) 4 MG tablet TAKE 1/2 TABLETS (2 MG TOTAL) BY MOUTH 2 (TWO) TIMES DAILY. TAKE BEFORE MEALS   Homeopathic Products (ZICAM COLD REMEDY PO) Take by mouth as needed.   metFORMIN (GLUCOPHAGE) 1000 MG tablet TAKE 1 TABLET BY MOUTH TWICE A DAY   montelukast (SINGULAIR) 10 MG tablet Take 10 mg by mouth daily as needed.   pioglitazone  (ACTOS ) 45 MG tablet TAKE 1 TABLET BY MOUTH EVERY DAY   rosuvastatin  (CRESTOR ) 5  MG tablet TAKE 1 TABLET (5 MG TOTAL) BY MOUTH DAILY.   Semaglutide  (RYBELSUS ) 7 MG TABS Take 1 tablet (7 mg total) by mouth daily.   sildenafil (VIAGRA) 50 MG tablet Take 50 mg by mouth daily as needed for erectile dysfunction.   terbinafine  (LAMISIL ) 250 MG tablet Take 1 tablet (250 mg total) by mouth daily.   Blood Glucose Monitoring Suppl (ACCU-CHEK AVIVA PLUS) w/Device KIT Use to check blood sugar daily for type 2 diabetes. E11.9   glucose blood (ACCU-CHEK AVIVA PLUS) test strip Use to check blood sugar once a day   No facility-administered medications prior to visit.    Review of Systems  Constitutional:  Negative for appetite change, chills and fever.  Respiratory:  Negative for chest tightness, shortness of breath and wheezing.   Cardiovascular:  Negative for chest pain and palpitations.  Gastrointestinal:  Negative for abdominal pain, nausea and vomiting.       Objective    BP 119/67 (BP Location: Left Arm, Patient Position: Sitting, Cuff Size: Normal)   Pulse (!) 58   Resp 16   Ht 5\' 11"  (1.803 m)   Wt 193 lb 8 oz (87.8 kg)   SpO2 100%   BMI 26.99 kg/m    Physical Exam   General: Appearance:    Well developed,  well nourished male in no acute distress  Eyes:    PERRL, conjunctiva/corneas clear, EOM's intact       Lungs:     Clear to auscultation bilaterally, respirations unlabored  Heart:    Bradycardic. Normal rhythm. No murmurs, rubs, or gallops.    MS:   All extremities are intact.  Slightly tender over acromium. FROM, but pain with shoulder extension over 90 degrees.   Neurologic:   Awake, alert, oriented x 3. No apparent focal neurological defect.         Results for orders placed or performed in visit on 11/04/23  POCT glycosylated hemoglobin (Hb A1C)  Result Value Ref Range   Hemoglobin A1C 6.6 (A) 4.0 - 5.6 %   Est. average glucose Bld gHb Est-mCnc 143     Assessment & Plan     1. Type 2 diabetes mellitus with retinopathy, without long-term current  use of insulin, macular edema presence unspecified, unspecified laterality, unspecified retinopathy severity (HCC) (Primary) Very well controlled with occasional early morning lows. He can stop his night time dose of glimepiride . Continue all other medications unchanged.   - Urine microalbumin-creatinine with uACR - glimepiride  (AMARYL ) 4 MG tablet; 1/2 table every morning  2. Onychomycosis Much improved, but not completely resolved.   restart terbinafine  (LAMISIL ) 250 MG tablet; Take 1 tablet (250 mg total) by mouth daily.  Dispense: 30 tablet; Refill: 3  3. Bursitis of right shoulder Improved today. Recommend apply ice 5-10 after any shoulder activities including golf. Consider orthopedic referral.    Return in about 6 months (around 05/06/2024) for Yearly Physical.         Jeralene Mom, MD  [facility] [phone] (phone) [phone] (fax)  [facility]

## 2023-11-04 NOTE — Patient Instructions (Signed)
 [name] Kitchen  Please review the attached list of medications and notify my office if there are any errors.   . Please bring all of your medications to every appointment so we can make sure that our medication list is the same as yours.

## 2023-11-09 LAB — MICROALBUMIN / CREATININE URINE RATIO
Creatinine, Urine: 187.8 mg/dL
Microalb/Creat Ratio: 11 mg/g{creat} (ref 0–29)
Microalbumin, Urine: 20.3 ug/mL

## 2024-01-08 LAB — HM DIABETES EYE EXAM

## 2024-02-29 ENCOUNTER — Other Ambulatory Visit: Payer: Self-pay | Admitting: Family Medicine

## 2024-02-29 DIAGNOSIS — B351 Tinea unguium: Secondary | ICD-10-CM

## 2024-03-26 ENCOUNTER — Other Ambulatory Visit: Payer: Self-pay | Admitting: Family Medicine

## 2024-03-29 NOTE — Progress Notes (Signed)
 Mark Fitzpatrick                                          MRN: [id number]   03/29/2024   The [facility] Quality Team Specialist reviewed this patient medical record for the purposes of chart review for care gap closure. The following were reviewed: chart review for care gap closure-kidney health evaluation for diabetes:eGFR  and uACR.    [facility]BCI Quality Team

## 2024-04-06 ENCOUNTER — Ambulatory Visit (INDEPENDENT_AMBULATORY_CARE_PROVIDER_SITE_OTHER)

## 2024-04-06 DIAGNOSIS — Z23 Encounter for immunization: Secondary | ICD-10-CM | POA: Diagnosis not present

## 2024-05-07 ENCOUNTER — Ambulatory Visit: Payer: Self-pay | Admitting: Family Medicine

## 2024-05-07 ENCOUNTER — Encounter: Payer: Self-pay | Admitting: Family Medicine

## 2024-05-07 VITALS — BP 135/78 | HR 61 | Temp 97.9°F | Resp 16 | Ht 70.5 in | Wt 189.9 lb

## 2024-05-07 DIAGNOSIS — H6122 Impacted cerumen, left ear: Secondary | ICD-10-CM

## 2024-05-07 DIAGNOSIS — Z0000 Encounter for general adult medical examination without abnormal findings: Secondary | ICD-10-CM

## 2024-05-07 DIAGNOSIS — E11319 Type 2 diabetes mellitus with unspecified diabetic retinopathy without macular edema: Secondary | ICD-10-CM | POA: Diagnosis not present

## 2024-05-07 DIAGNOSIS — Z125 Encounter for screening for malignant neoplasm of prostate: Secondary | ICD-10-CM | POA: Diagnosis not present

## 2024-05-07 DIAGNOSIS — Z0001 Encounter for general adult medical examination with abnormal findings: Secondary | ICD-10-CM

## 2024-05-07 DIAGNOSIS — Z7984 Long term (current) use of oral hypoglycemic drugs: Secondary | ICD-10-CM | POA: Diagnosis not present

## 2024-05-07 NOTE — Patient Instructions (Signed)
Please review the attached list of medications and notify my office if there are any errors.   Please bring all of your medications to every appointment so we can make sure that our medication list is the same as yours.   You are due for a Tdap (tetanus-diptheria-pertussis vaccine) which protects you from tetanus and whooping cough. Please check with your insurance plan or pharmacy regarding coverage for this vaccine.   

## 2024-05-07 NOTE — Progress Notes (Signed)
 Annual Wellness Visit     Patient: Mark Fitzpatrick, Male    DOB: 1950-11-15, 73 y.o.   MRN: [id number] Visit Date: 05/07/2024  Today's Provider: Nancyann Perry, MD    Subjective      Medications: Outpatient Medications Prior to Visit  Medication Sig   Accu-Chek Softclix Lancets lancets Use to check blood sugar daily for type 2 diabetes. E11.9   aspirin  EC 81 MG tablet Take 1 tablet (81 mg total) by mouth daily.   BIOTIN PO Take by mouth.   Blood Glucose Monitoring Suppl DEVI 1 each by Does not apply route in the morning, at noon, and at bedtime. May substitute to any manufacturer covered by patient's insurance.   CINNAMON PO Take 1,000 mg by mouth 2 (two) times daily.    ELDERBERRY PO Take 1 Dose by mouth daily.   glimepiride  (AMARYL ) 4 MG tablet 1/2 table every morning   metFORMIN (GLUCOPHAGE) 1000 MG tablet TAKE 1 TABLET BY MOUTH TWICE A DAY   montelukast (SINGULAIR) 10 MG tablet Take 10 mg by mouth daily as needed.   pioglitazone  (ACTOS ) 45 MG tablet TAKE 1 TABLET BY MOUTH EVERY DAY   Semaglutide  (RYBELSUS ) 7 MG TABS Take 1 tablet (7 mg total) by mouth daily.   sildenafil (VIAGRA) 50 MG tablet Take 50 mg by mouth daily as needed for erectile dysfunction.   [DISCONTINUED] Homeopathic Products (ZICAM COLD REMEDY PO) Take by mouth as needed.   [DISCONTINUED] rosuvastatin  (CRESTOR ) 5 MG tablet TAKE 1 TABLET (5 MG TOTAL) BY MOUTH DAILY.   [DISCONTINUED] terbinafine  (LAMISIL ) 250 MG tablet TAKE 1 TABLET BY MOUTH EVERY DAY   No facility-administered medications prior to visit.    Allergies  Allergen Reactions   Atorvastatin      Muscle pains   Pravastatin  Swelling    Swelling of joints    Patient Care Team: Perry Nancyann BRAVO, MD as PCP - General (Family Medicine) Jaye Fallow, MD as Referring Physician (Ophthalmology)  Review of Systems  Constitutional:  Negative for appetite change, chills and fever.  Respiratory:  Negative for chest tightness, shortness of  breath and wheezing.   Cardiovascular:  Negative for chest pain and palpitations.  Gastrointestinal:  Negative for abdominal pain, nausea and vomiting.         Objective      Most recent functional status assessment:    05/07/2024    1:17 PM  In your present state of health, do you have any difficulty performing the following activities:  Hearing? 0  Vision? 0  Difficulty concentrating or making decisions? 0  Walking or climbing stairs? 0  Dressing or bathing? 0  Doing errands, shopping? 0   Most recent fall risk assessment:    05/07/2024   10:30 AM  Fall Risk   Falls in the past year? 0  Number falls in past yr: 0  Injury with Fall? 0  Risk for fall due to : No Fall Risks    Most recent depression screenings:    11/04/2023   11:17 AM 05/07/2023    9:10 AM  PHQ 2/9 Scores  PHQ - 2 Score 0 0  PHQ- 9 Score  0      Data saved with a previous flowsheet row definition   Most recent cognitive screening:    05/07/2024   10:30 AM  6CIT Screen  What Year? 0 points  What month? 0 points  What time? 0 points  Count back from 20 0 points  Months  in reverse 0 points  Repeat phrase 0 points  Total Score 0 points   Most recent Audit-C alcohol use screening    05/06/2024    6:29 PM  Alcohol Use Disorder Test (AUDIT)  1. How often do you have a drink containing alcohol? 0  3. How often do you have six or more drinks on one occasion? 0   A score of 3 or more in women, and 4 or more in men indicates increased risk for alcohol abuse, EXCEPT if all of the points are from question 1   No results found for any visits on 05/07/24.  Assessment & Plan     Annual wellness visit done today including the all of the following: Reviewed patient's Family Medical History Reviewed and updated list of patient's medical providers Assessment of cognitive impairment was done Assessed patient's functional ability Established a written schedule for health screening services Health Risk  Assessent Completed and Reviewed  Exercise Activities and Dietary recommendations  Goals      DIET - INCREASE WATER INTAKE     Recommend increasing water intake to 4-6 glasses a day.       Exercise 150 min/wk Moderate Activity        Immunization History  Administered Date(s) Administered   Fluad Quad(high Dose 65+) 04/14/2019, 04/18/2021, 04/23/2022   Fluad Trivalent(High Dose 65+) 05/07/2023   INFLUENZA, HIGH DOSE SEASONAL PF 04/21/2015, 03/20/2016, 05/15/2017, 05/13/2018, 04/06/2024   Influenza Split 03/12/2012   Influenza,inj,Quad PF,6+ Mos 04/14/2013, 04/25/2014   Influenza-Unspecified 04/07/2020   PFIZER(Purple Top)SARS-COV-2 Vaccination 08/09/2019, 09/03/2019, 04/10/2020   Pneumococcal Conjugate-13 04/21/2015   Pneumococcal Polysaccharide-23 05/23/2010, 10/21/2016   Tdap 12/25/2006, 12/27/2013   Zoster Recombinant(Shingrix) 02/05/2018, 08/04/2018   Zoster, Live 12/27/2013    Health Maintenance  Topic Date Due   DTaP/Tdap/Td (3 - Td or Tdap) 12/28/2023   COVID-19 Vaccine (4 - 2025-26 season) 03/01/2024   Diabetic kidney evaluation - eGFR measurement  05/06/2024   HEMOGLOBIN A1C  05/06/2024   Diabetic kidney evaluation - Urine ACR  11/05/2024   OPHTHALMOLOGY EXAM  01/07/2025   Medicare Annual Wellness (AWV)  05/07/2025   Fecal DNA (Cologuard)  03/10/2026   Pneumococcal Vaccine: 50+ Years  Completed   Influenza Vaccine  Completed   Hepatitis C Screening  Completed   Zoster Vaccines- Shingrix  Completed   Meningococcal B Vaccine  Aged Out     Discussed health benefits of physical activity, and encouraged him to engage in regular exercise appropriate for his age and condition.      Return in about 6 months (around 11/04/2024) for Diabetes.        Nancyann Perry, MD  [facility] [phone] (phone) [phone] (fax)  [facility]

## 2024-05-07 NOTE — Progress Notes (Signed)
 Complete physical exam   Patient: Mark Fitzpatrick   DOB: 02/12/1951   73 y.o. Male  MRN: [id number] Visit Date: 05/07/2024  Today's healthcare provider: Nancyann Perry, MD    Subjective    Discussed the use of AI scribe software for clinical note transcription with the patient, who gave verbal consent to proceed.  History of Present Illness   Mark Fitzpatrick is a 73 year old male who presents for an annual physical exam.  He discontinued rosuvastatin  approximately one month ago due to severe pain in his knuckles, which prevented him from clenching his fist and gripping a golf club. He had been on this medication for a while, having tried two other statins previously. The knuckle pain developed after a period of use without issues.  He monitors his blood sugar at home, with readings ranging from 80 to 140 mg/dL. He is currently taking Rybelsus  7 mg, Amaryl  half a tablet, and metformin twice a day. No stomach issues with Rybelsus  and notes weight loss as a positive outcome. No constipation or changes in bowel habits.  He stopped taking terbinafine  after three months as it was ineffective this time, despite having worked well in the past. His condition is not as severe as before, and he still has some medication left from the last refill.  He maintains an active lifestyle, walking daily for 30 minutes to a mile and playing golf regularly, although he uses a cart. He had cataract surgery in the past and no longer requires glasses. He sees his eye doctor once a year.         Past Medical History:  Diagnosis Date   Allergy    Bradycardia    Bursitis of shoulder    Cataract    Diabetes mellitus without complication (HCC)    Dysplastic nevus 05/15/2023   L medial bicep, moderate atypia   Erectile dysfunction    Heart murmur    as child   History of broken nose    Hx of dysplastic nevus 07/13/2012   L lat infrapectoral costal area, moderate   Shoulder pain    Past Surgical  History:  Procedure Laterality Date   CATARACT EXTRACTION W/PHACO Right 05/28/2022   Procedure: CATARACT EXTRACTION PHACO AND INTRAOCULAR LENS PLACEMENT (IOC) RIGHT DIABETIC CLAREON VIVITY TORIC LENS  9.61  00:53.8;  Surgeon: Jaye Fallow, MD;  Location: [facility];  Service: Ophthalmology;  Laterality: Right;   CATARACT EXTRACTION W/PHACO Left 06/11/2022   Procedure: CATARACT EXTRACTION PHACO AND INTRAOCULAR LENS PLACEMENT (IOC) LEFT DIABETIC CLAREON VIVITY TORIC LENS 9.95 00:58.2;  Surgeon: Jaye Fallow, MD;  Location: [facility];  Service: Ophthalmology;  Laterality: Left;  Diabetic   collar bone fracture     COLONOSCOPY     EYE SURGERY  2023   Cataract surgery   RECONSTRUCTION OF NOSE  07/01/1994   Social History   Socioeconomic History   Marital status: Married    Spouse name: Not on file   Number of children: 0   Years of education: H/S   Highest education level: 12th grade  Occupational History   Occupation: Retired  Tobacco Use   Smoking status: Former    Current packs/day: 0.00    Average packs/day: 0.3 packs/day for 5.0 years (1.3 ttl pk-yrs)    Types: Cigarettes    Start date: 07/01/1979    Quit date: 06/30/1984    Years since quitting: 39.8   Smokeless tobacco: Never   Tobacco comments:  started smoking at age 69  Vaping Use   Vaping status: Never Used  Substance and Sexual Activity   Alcohol use: Not Currently   Drug use: No   Sexual activity: Yes    Birth control/protection: None  Other Topics Concern   Not on file  Social History Narrative   Not on file   Social Drivers of Health   Financial Resource Strain: Low Risk  (05/06/2024)   Overall Financial Resource Strain (CARDIA)    Difficulty of Paying Living Expenses: Not hard at all  Food Insecurity: No Food Insecurity (05/07/2024)   Hunger Vital Sign    Worried About Running Out of Food in the Last Year: Never true    Ran Out of Food in the Last Year: Never true   Transportation Needs: No Transportation Needs (05/07/2024)   PRAPARE - Administrator, Civil Service (Medical): No    Lack of Transportation (Non-Medical): No  Physical Activity: Sufficiently Active (05/07/2024)   Exercise Vital Sign    Days of Exercise per Week: 7 days    Minutes of Exercise per Session: 60 min  Stress: No Stress Concern Present (05/07/2024)   [employer] of Occupational Health - Occupational Stress Questionnaire    Feeling of Stress: Not at all  Social Connections: Socially Integrated (05/07/2024)   Social Connection and Isolation Panel    Frequency of Communication with Friends and Family: Three times a week    Frequency of Social Gatherings with Friends and Family: Once a week    Attends Religious Services: More than 4 times per year    Active Member of Clubs or Organizations: Yes    Attends Banker Meetings: More than 4 times per year    Marital Status: Married  Catering Manager Violence: Not At Risk (05/07/2024)   Humiliation, Afraid, Rape, and Kick questionnaire    Fear of Current or Ex-Partner: No    Emotionally Abused: No    Physically Abused: No    Sexually Abused: No   Family Status  Relation Name Status   Mother Rubin Dais Alive   Father Loreto Loescher Deceased at age 44   Brother  Alive   Brother  Alive   MGM Mamie Burris Cleatus Baller Deceased   MGF  Deceased at age 35s   PGM  Deceased at age 21   PGF  Deceased at age 66s       MI   Neg Hx  (Not Specified)  No partnership data on file   Family History  Problem Relation Age of Onset   Diabetes Mother    Dementia Mother    Hypertension Mother    Bone cancer Father        spread to liver   Diabetes Father    Cancer Father    Healthy Brother    Healthy Brother    Ovarian cancer Maternal Grandmother    Cancer Maternal Grandfather        unknown   Heart attack Paternal Grandfather    Colon cancer Neg Hx    Prostate cancer Neg Hx    Allergies  Allergen  Reactions   Atorvastatin      Muscle pains   Pravastatin  Swelling    Swelling of joints    Patient Care Team: Gasper Nancyann BRAVO, MD as PCP - General (Family Medicine) Jaye Fallow, MD as Referring Physician (Ophthalmology)   Medications: Outpatient Medications Prior to Visit  Medication Sig   Accu-Chek Softclix Lancets lancets Use to  check blood sugar daily for type 2 diabetes. E11.9   aspirin  EC 81 MG tablet Take 1 tablet (81 mg total) by mouth daily.   BIOTIN PO Take by mouth.   Blood Glucose Monitoring Suppl DEVI 1 each by Does not apply route in the morning, at noon, and at bedtime. May substitute to any manufacturer covered by patient's insurance.   CINNAMON PO Take 1,000 mg by mouth 2 (two) times daily.    ELDERBERRY PO Take 1 Dose by mouth daily.   glimepiride  (AMARYL ) 4 MG tablet 1/2 table every morning   metFORMIN (GLUCOPHAGE) 1000 MG tablet TAKE 1 TABLET BY MOUTH TWICE A DAY   montelukast (SINGULAIR) 10 MG tablet Take 10 mg by mouth daily as needed.   pioglitazone  (ACTOS ) 45 MG tablet TAKE 1 TABLET BY MOUTH EVERY DAY   Semaglutide  (RYBELSUS ) 7 MG TABS Take 1 tablet (7 mg total) by mouth daily.   sildenafil (VIAGRA) 50 MG tablet Take 50 mg by mouth daily as needed for erectile dysfunction.   [DISCONTINUED] Homeopathic Products (ZICAM COLD REMEDY PO) Take by mouth as needed.   [DISCONTINUED] rosuvastatin  (CRESTOR ) 5 MG tablet TAKE 1 TABLET (5 MG TOTAL) BY MOUTH DAILY.   [DISCONTINUED] terbinafine  (LAMISIL ) 250 MG tablet TAKE 1 TABLET BY MOUTH EVERY DAY   No facility-administered medications prior to visit.    Review of Systems  Constitutional:  Negative for appetite change, chills and fever.  Respiratory:  Negative for chest tightness, shortness of breath and wheezing.   Cardiovascular:  Negative for chest pain and palpitations.  Gastrointestinal:  Negative for abdominal pain, nausea and vomiting.       Objective    BP 135/78 (BP Location: Left Arm, Patient  Position: Sitting, Cuff Size: Normal)   Pulse 61   Temp 97.9 F (36.6 C) (Oral)   Resp 16   Ht 5' 10.5 (1.791 m)   Wt 189 lb 14.4 oz (86.1 kg)   SpO2 100%   BMI 26.86 kg/m    Physical Exam  General Appearance:    Well developed, well nourished male. Alert, cooperative, in no acute distress, appears stated age  Head:    Normocephalic, without obvious abnormality, atraumatic  Eyes:    PERRL, conjunctiva/corneas clear, EOM's intact, fundi    benign, both eyes       Ears:    Tms blocked excessive cerumen, L>R. Not obstructed.   Nose:   Nares normal, septum midline, mucosa normal, no drainage   or sinus tenderness  Throat:   Lips, mucosa, and tongue normal; teeth and gums normal  Neck:   Supple, symmetrical, trachea midline, no adenopathy;       thyroid:  No enlargement/tenderness/nodules; no carotid   bruit or JVD  Back:     Symmetric, no curvature, ROM normal, no CVA tenderness  Lungs:     Clear to auscultation bilaterally, respirations unlabored  Chest wall:    No tenderness or deformity  Heart:    Normal heart rate. Normal rhythm. No murmurs, rubs, or gallops.  S1 and S2 normal  Abdomen:     Soft, non-tender, bowel sounds active all four quadrants,    no masses, no organomegaly  Genitalia:    deferred  Rectal:    deferred  Extremities:   All extremities are intact. No cyanosis or edema  Pulses:   2+ and symmetric all extremities  Skin:   Skin color, texture, turgor normal, no rashes or lesions  Lymph nodes:   Cervical, supraclavicular, and  axillary nodes normal  Neurologic:   CNII-XII intact. Normal strength, sensation and reflexes      throughout       Assessment & Plan    Routine Health Maintenance and Physical Exam  Exercise Activities and Dietary recommendations  Goals      DIET - INCREASE WATER INTAKE     Recommend increasing water intake to 4-6 glasses a day.       Exercise 150 min/wk Moderate Activity        Immunization History  Administered Date(s)  Administered   Fluad Quad(high Dose 65+) 04/14/2019, 04/18/2021, 04/23/2022   Fluad Trivalent(High Dose 65+) 05/07/2023   INFLUENZA, HIGH DOSE SEASONAL PF 04/21/2015, 03/20/2016, 05/15/2017, 05/13/2018, 04/06/2024   Influenza Split 03/12/2012   Influenza,inj,Quad PF,6+ Mos 04/14/2013, 04/25/2014   Influenza-Unspecified 04/07/2020   PFIZER(Purple Top)SARS-COV-2 Vaccination 08/09/2019, 09/03/2019, 04/10/2020   Pneumococcal Conjugate-13 04/21/2015   Pneumococcal Polysaccharide-23 05/23/2010, 10/21/2016   Tdap 12/25/2006, 12/27/2013   Zoster Recombinant(Shingrix) 02/05/2018, 08/04/2018   Zoster, Live 12/27/2013    Health Maintenance  Topic Date Due   DTaP/Tdap/Td (3 - Td or Tdap) 12/28/2023   COVID-19 Vaccine (4 - 2025-26 season) 03/01/2024   Diabetic kidney evaluation - eGFR measurement  05/06/2024   HEMOGLOBIN A1C  05/06/2024   Diabetic kidney evaluation - Urine ACR  11/05/2024   OPHTHALMOLOGY EXAM  01/07/2025   Medicare Annual Wellness (AWV)  05/07/2025   Fecal DNA (Cologuard)  03/10/2026   Pneumococcal Vaccine: 50+ Years  Completed   Influenza Vaccine  Completed   Hepatitis C Screening  Completed   Zoster Vaccines- Shingrix  Completed   Meningococcal B Vaccine  Aged Out    Discussed health benefits of physical activity, and encouraged him to engage in regular exercise appropriate for his age and condition.     Adult Wellness Visit Routine annual exam with no acute concerns. Mild earwax buildup noted. - Continue regular physical activity, including walking and golfing. - Monitor earwax buildup and use earwax removal drops if hearing issues arise.  Type 2 diabetes mellitus Blood glucose 80-140 mg/dL. No side effects from Rybelsus . Noted weight loss. - Continue Rybelsus  7 mg daily. - Continue Amaryl  half tablet daily. - Continue metformin twice daily. - Ordered fasting blood work to check A1c levels.  Statin intolerance due to muscle pain. Discontinued rosuvastatin .  Considering ezetimibe if cholesterol elevated. - Ordered fasting lipid panel. - Consider starting ezetimibe (Zetia) if cholesterol levels are elevated.  Onychomycosis Previous terbinafine  treatment ineffective. Discontinued due to efficacy and renal concerns. - Monitor symptoms and consider reinitiating terbinafine  if symptoms worsen.  Prostate cancer screening Due for PSA testing. - Ordered PSA test.     Return in about 6 months (around 11/04/2024) for Diabetes.        Nancyann Perry, MD  [facility] [phone] (phone) [phone] (fax)

## 2024-05-08 LAB — LIPID PANEL
Chol/HDL Ratio: 2.3 ratio (ref 0.0–5.0)
Cholesterol, Total: 183 mg/dL (ref 100–199)
HDL: 80 mg/dL (ref >39)
LDL Chol Calc (NIH): 93 mg/dL (ref 0–99)
Triglycerides: 49 mg/dL (ref 0–149)
VLDL Cholesterol Cal: 10 mg/dL (ref 5–40)

## 2024-05-08 LAB — HEMOGLOBIN A1C
Est. average glucose Bld gHb Est-mCnc: 143 mg/dL
Hgb A1c MFr Bld: 6.6 % — ABNORMAL HIGH (ref 4.8–5.6)

## 2024-05-08 LAB — COMPREHENSIVE METABOLIC PANEL WITH GFR
ALT: 13 IU/L (ref 0–44)
AST: 18 IU/L (ref 0–40)
Albumin: 4.7 g/dL (ref 3.8–4.8)
Alkaline Phosphatase: 49 IU/L (ref 47–123)
BUN/Creatinine Ratio: 14 (ref 10–24)
BUN: 15 mg/dL (ref 8–27)
Bilirubin Total: 0.5 mg/dL (ref 0.0–1.2)
CO2: 24 mmol/L (ref 20–29)
Calcium: 9.6 mg/dL (ref 8.6–10.2)
Chloride: 104 mmol/L (ref 96–106)
Creatinine, Ser: 1.06 mg/dL (ref 0.76–1.27)
Globulin, Total: 2.4 g/dL (ref 1.5–4.5)
Glucose: 143 mg/dL — ABNORMAL HIGH (ref 70–99)
Potassium: 5.5 mmol/L — ABNORMAL HIGH (ref 3.5–5.2)
Sodium: 141 mmol/L (ref 134–144)
Total Protein: 7.1 g/dL (ref 6.0–8.5)
eGFR: 74 mL/min/1.73 (ref >59)

## 2024-05-08 LAB — CBC
Hematocrit: 43.6 % (ref 37.5–51.0)
Hemoglobin: 14.3 g/dL (ref 13.0–17.7)
MCH: 30.6 pg (ref 26.6–33.0)
MCHC: 32.8 g/dL (ref 31.5–35.7)
MCV: 93 fL (ref 79–97)
Platelets: 255 x10E3/uL (ref 150–450)
RBC: 4.68 x10E6/uL (ref 4.14–5.80)
RDW: 12.2 % (ref 11.6–15.4)
WBC: 6.4 x10E3/uL (ref 3.4–10.8)

## 2024-05-08 LAB — PSA TOTAL (REFLEX TO FREE): Prostate Specific Ag, Serum: 2.5 ng/mL (ref 0.0–4.0)

## 2024-05-09 ENCOUNTER — Ambulatory Visit: Payer: Self-pay | Admitting: Family Medicine

## 2024-05-09 DIAGNOSIS — G720 Drug-induced myopathy: Secondary | ICD-10-CM

## 2024-05-09 DIAGNOSIS — E11319 Type 2 diabetes mellitus with unspecified diabetic retinopathy without macular edema: Secondary | ICD-10-CM

## 2024-05-09 MED ORDER — EZETIMIBE 10 MG PO TABS: 10.0000 mg | ORAL_TABLET | Freq: Every day | ORAL | 3 refills | Status: AC

## 2024-05-19 ENCOUNTER — Ambulatory Visit: Payer: Medicare PPO | Admitting: Dermatology

## 2024-05-19 DIAGNOSIS — L821 Other seborrheic keratosis: Secondary | ICD-10-CM | POA: Diagnosis not present

## 2024-05-19 DIAGNOSIS — Z1283 Encounter for screening for malignant neoplasm of skin: Secondary | ICD-10-CM

## 2024-05-19 DIAGNOSIS — W908XXA Exposure to other nonionizing radiation, initial encounter: Secondary | ICD-10-CM

## 2024-05-19 DIAGNOSIS — Z7189 Other specified counseling: Secondary | ICD-10-CM

## 2024-05-19 DIAGNOSIS — L578 Other skin changes due to chronic exposure to nonionizing radiation: Secondary | ICD-10-CM

## 2024-05-19 DIAGNOSIS — L814 Other melanin hyperpigmentation: Secondary | ICD-10-CM | POA: Diagnosis not present

## 2024-05-19 DIAGNOSIS — D692 Other nonthrombocytopenic purpura: Secondary | ICD-10-CM

## 2024-05-19 DIAGNOSIS — L570 Actinic keratosis: Secondary | ICD-10-CM | POA: Diagnosis not present

## 2024-05-19 DIAGNOSIS — D1801 Hemangioma of skin and subcutaneous tissue: Secondary | ICD-10-CM

## 2024-05-19 DIAGNOSIS — L820 Inflamed seborrheic keratosis: Secondary | ICD-10-CM | POA: Diagnosis not present

## 2024-05-19 DIAGNOSIS — Z86018 Personal history of other benign neoplasm: Secondary | ICD-10-CM

## 2024-05-19 DIAGNOSIS — D229 Melanocytic nevi, unspecified: Secondary | ICD-10-CM

## 2024-05-19 DIAGNOSIS — L603 Nail dystrophy: Secondary | ICD-10-CM

## 2024-05-19 NOTE — Patient Instructions (Addendum)
 Actinic keratoses are precancerous spots that appear secondary to cumulative UV radiation exposure/sun exposure over time. They are chronic with expected duration over 1 year. A portion of actinic keratoses will progress to squamous cell carcinoma of the skin. It is not possible to reliably predict which spots will progress to skin cancer and so treatment is recommended to prevent development of skin cancer.  Recommend daily broad spectrum sunscreen SPF 30+ to sun-exposed areas, reapply every 2 hours as needed.  Recommend staying in the shade or wearing long sleeves, sun glasses (UVA+UVB protection) and wide brim hats (4-inch brim around the entire circumference of the hat). Call for new or changing lesions.     Cryotherapy Aftercare  Wash gently with soap and water everyday.   Apply Vaseline and Band-Aid daily until healed.    Seborrheic Keratosis  What causes seborrheic keratoses? Seborrheic keratoses are harmless, common skin growths that first appear during adult life.  As time goes by, more growths appear.  Some people may develop a large number of them.  Seborrheic keratoses appear on both covered and uncovered body parts.  They are not caused by sunlight.  The tendency to develop seborrheic keratoses can be inherited.  They vary in color from skin-colored to gray, brown, or even black.  They can be either smooth or have a rough, warty surface.   Seborrheic keratoses are superficial and look as if they were stuck on the skin.  Under the microscope this type of keratosis looks like layers upon layers of skin.  That is why at times the top layer may seem to fall off, but the rest of the growth remains and re-grows.    Treatment Seborrheic keratoses do not need to be treated, but can easily be removed in the office.  Seborrheic keratoses often cause symptoms when they rub on clothing or jewelry.  Lesions can be in the way of shaving.  If they become inflamed, they can cause itching, soreness,  or burning.  Removal of a seborrheic keratosis can be accomplished by freezing, burning, or surgery. If any spot bleeds, scabs, or grows rapidly, please return to have it checked, as these can be an indication of a skin cancer.   Melanoma ABCDEs  Melanoma is the most dangerous type of skin cancer, and is the leading cause of death from skin disease.  You are more likely to develop melanoma if you: Have light-colored skin, light-colored eyes, or red or blond hair Spend a lot of time in the sun Tan regularly, either outdoors or in a tanning bed Have had blistering sunburns, especially during childhood Have a close family member who has had a melanoma Have atypical moles or large birthmarks  Early detection of melanoma is key since treatment is typically straightforward and cure rates are extremely high if we catch it early.   The first sign of melanoma is often a change in a mole or a new dark spot.  The ABCDE system is a way of remembering the signs of melanoma.  A for asymmetry:  The two halves do not match. B for border:  The edges of the growth are irregular. C for color:  A mixture of colors are present instead of an even brown color. D for diameter:  Melanomas are usually (but not always) greater than 6mm - the size of a pencil eraser. E for evolution:  The spot keeps changing in size, shape, and color.  Please check your skin once per month between visits. You can  use a small mirror in front and a large mirror behind you to keep an eye on the back side or your body.   If you see any new or changing lesions before your next follow-up, please call to schedule a visit.  Please continue daily skin protection including broad spectrum sunscreen SPF 30+ to sun-exposed areas, reapplying every 2 hours as needed when you're outdoors.   Staying in the shade or wearing long sleeves, sun glasses (UVA+UVB protection) and wide brim hats (4-inch brim around the entire circumference of the hat) are  also recommended for sun protection.    Due to recent changes in healthcare laws, you may see results of your pathology and/or laboratory studies on MyChart before the doctors have had a chance to review them. We understand that in some cases there may be results that are confusing or concerning to you. Please understand that not all results are received at the same time and often the doctors may need to interpret multiple results in order to provide you with the best plan of care or course of treatment. Therefore, we ask that you please give us  2 business days to thoroughly review all your results before contacting the office for clarification. Should we see a critical lab result, you will be contacted sooner.   If You Need Anything After Your Visit  If you have any questions or concerns for your doctor, please call our main line at [phone] and press option 4 to reach your doctor's medical assistant. If no one answers, please leave a voicemail as directed and we will return your call as soon as possible. Messages left after 4 pm will be answered the following business day.   You may also send us  a message via MyChart. We typically respond to MyChart messages within 1-2 business days.  For prescription refills, please ask your pharmacy to contact our office. Our fax number is [phone].  If you have an urgent issue when the clinic is closed that cannot wait until the next business day, you can page your doctor at the number below.    Please note that while we do our best to be available for urgent issues outside of office hours, we are not available 24/7.   If you have an urgent issue and are unable to reach us , you may choose to seek medical care at your doctor's office, retail clinic, urgent care center, or emergency room.  If you have a medical emergency, please immediately call 911 or go to the emergency department.  Pager Numbers  - Dr. Hester: [phone]  - Dr. Jackquline:  [phone]  - Dr. Claudene: [phone]   - Dr. Raymund: [phone]  In the event of inclement weather, please call our main line at [phone] for an update on the status of any delays or closures.  Dermatology Medication Tips: Please keep the boxes that topical medications come in in order to help keep track of the instructions about where and how to use these. Pharmacies typically print the medication instructions only on the boxes and not directly on the medication tubes.   If your medication is too expensive, please contact our office at [phone] option 4 or send us  a message through MyChart.   We are unable to tell what your co-pay for medications will be in advance as this is different depending on your insurance coverage. However, we may be able to find a substitute medication at lower cost or fill out paperwork to get insurance to cover  a needed medication.   If a prior authorization is required to get your medication covered by your insurance company, please allow us  1-2 business days to complete this process.  Drug prices often vary depending on where the prescription is filled and some pharmacies may offer cheaper prices.  The website www.goodrx.com contains coupons for medications through different pharmacies. The prices here do not account for what the cost may be with help from insurance (it may be cheaper with your insurance), but the website can give you the price if you did not use any insurance.  - You can print the associated coupon and take it with your prescription to the pharmacy.  - You may also stop by our office during regular business hours and pick up a GoodRx coupon card.  - If you need your prescription sent electronically to a different pharmacy, notify our office through [facility] or by phone at [phone] option 4.     Si Usted Necesita Algo Despus de Su Visita  Tambin puede enviarnos un mensaje a travs de Clinical cytogeneticist. Por lo general  respondemos a los mensajes de MyChart en el transcurso de 1 a 2 das hbiles.  Para renovar recetas, por favor pida a su farmacia que se ponga en contacto con nuestra oficina. Randi lakes de fax es [place] [phone].  Si tiene un asunto urgente cuando la clnica est cerrada y que no puede esperar hasta el siguiente da hbil, puede llamar/localizar a su doctor(a) al nmero que aparece a continuacin.   Por favor, tenga en cuenta que aunque hacemos todo lo posible para estar disponibles para asuntos urgentes fuera del horario de [place], no estamos disponibles las 24 horas del da, los 7 [address]  [address] de la [place].   Si tiene un problema urgente y no puede comunicarse con nosotros, puede optar por buscar atencin mdica  en el consultorio de su doctor(a), en una clnica privada, en un centro de atencin urgente o en una sala de emergencias.  Si tiene Engineer, drilling, por favor llame inmediatamente al 911 o vaya a la sala de emergencias.  Nmeros de bper  - Dr. Hester: [phone]  - Dra. Jackquline: [phone]  - Dr. Claudene: [phone]  - Dra. Kitts: [phone]  En caso de inclemencias del [place], por favor llame a nuestra lnea principal al [phone] para una actualizacin sobre el estado de cualquier retraso o cierre.  Consejos para la medicacin en dermatologa: Por favor, guarde las cajas en las que vienen los medicamentos de uso tpico para ayudarle a seguir las instrucciones sobre dnde y cmo usarlos. Las farmacias generalmente imprimen las instrucciones del medicamento slo en las cajas y no directamente en los tubos del [place].   Si su medicamento es muy caro, por favor, pngase en contacto con landry rieger llamando al [phone] y presione la opcin 4 o envenos un mensaje a travs de Clinical cytogeneticist.   No podemos decirle cul ser su copago por los medicamentos por adelantado ya que esto es diferente dependiendo de la cobertura de su seguro. Sin embargo, es posible  que podamos encontrar un medicamento sustituto a Audiological scientist un formulario para que el seguro cubra el medicamento que se considera necesario.   Si se requiere una autorizacin previa para que su compaa de seguros malta su medicamento, por favor permtanos de 1 a 2 das hbiles para completar este proceso.  Los precios de los medicamentos varan con frecuencia dependiendo del Environmental consultant de dnde se surte la receta y iraq  pueden ofrecer precios ms baratos.  El sitio web www.goodrx.com tiene cupones para medicamentos de Health and safety inspector. Los precios aqu no tienen en cuenta lo que podra costar con la ayuda del seguro (puede ser ms barato con su seguro), pero el sitio web puede darle el precio si no utiliz Tourist information centre manager.  - Puede imprimir el cupn correspondiente y llevarlo con su receta a la farmacia.  - Tambin puede pasar por nuestra oficina durante el horario de atencin regular y Education officer, museum una tarjeta de cupones de GoodRx.  - Si necesita que su receta se enve electrnicamente a una farmacia diferente, informe a nuestra oficina a travs de MyChart de [employer] o por telfono llamando al [phone] y presione la opcin 4.

## 2024-05-19 NOTE — Progress Notes (Signed)
 Follow-Up Visit   Subjective  Mark Fitzpatrick is a 73 y.o. male who presents for the following: Skin Cancer Screening and Full Body Skin Exam Hx of dysplastic nevi , hx of aks, hx of isks   The patient presents for Total-Body Skin Exam (TBSE) for skin cancer screening and mole check. The patient has spots, moles and lesions to be evaluated, some may be new or changing and the patient may have concern these could be cancer.    The following portions of the chart were reviewed this encounter and updated as appropriate: medications, allergies, medical history  Review of Systems:  No other skin or systemic complaints except as noted in HPI or Assessment and Plan.  Objective  Well appearing patient in no apparent distress; mood and affect are within normal limits.  A full examination was performed including scalp, head, eyes, ears, nose, lips, neck, chest, axillae, abdomen, back, buttocks, bilateral upper extremities, bilateral lower extremities, hands, feet, fingers, toes, fingernails, and toenails. All findings within normal limits unless otherwise noted below.   Relevant physical exam findings are noted in the Assessment and Plan.  right ear helix x 1 Erythematous thin papules/macules with gritty scale.  Right Temple x 1 Erythematous stuck-on, waxy papule or plaque  Assessment & Plan  HISTORY OF DYSPLASTIC NEVUS 05/15/2023 left medial bicep - moderate 07/13/2012 left lateral infra pectoral costal area moderate   No evidence of recurrence today Recommend regular full body skin exams Recommend daily broad spectrum sunscreen SPF 30+ to sun-exposed areas, reapply every 2 hours as needed.  Call if any new or changing lesions are noted between office visits     SKIN CANCER SCREENING PERFORMED TODAY.  ACTINIC DAMAGE - Chronic condition, secondary to cumulative UV/sun exposure - diffuse scaly erythematous macules with underlying dyspigmentation - Recommend daily broad spectrum  sunscreen SPF 30+ to sun-exposed areas, reapply every 2 hours as needed.  - Staying in the shade or wearing long sleeves, sun glasses (UVA+UVB protection) and wide brim hats (4-inch brim around the entire circumference of the hat) are also recommended for sun protection.  - Call for new or changing lesions.  LENTIGINES, SEBORRHEIC KERATOSES, HEMANGIOMAS - Benign normal skin lesions - Benign-appearing - Call for any changes  MELANOCYTIC NEVI - Tan-brown and/or pink-flesh-colored symmetric macules and papules - Benign appearing on exam today - Observation - Call clinic for new or changing moles - Recommend daily use of broad spectrum spf 30+ sunscreen to sun-exposed areas.   Purpura - Chronic; persistent and recurrent.  Treatable, but not curable. - Violaceous macules and patches - Benign - Related to trauma, age, sun damage and/or use of blood thinners, chronic use of topical and/or oral steroids - Observe - Can use OTC arnica containing moisturizer such as Dermend Bruise Formula if desired - Call for worsening or other concerns  NAIL PROBLEM Exam: dystrophic nail at b/l toenails   Treatment Plan: Discussed molercular study   Patient reports  Has done terbinafine  pills in past  Dr. Gasper had been in the past  Fished 3 month ago about a month ago Do not recommend mollecular study at this time    ACTINIC KERATOSIS right ear helix x 1 Actinic keratoses are precancerous spots that appear secondary to cumulative UV radiation exposure/sun exposure over time. They are chronic with expected duration over 1 year. A portion of actinic keratoses will progress to squamous cell carcinoma of the skin. It is not possible to reliably predict which spots will progress  to skin cancer and so treatment is recommended to prevent development of skin cancer.  Recommend daily broad spectrum sunscreen SPF 30+ to sun-exposed areas, reapply every 2 hours as needed.  Recommend staying in the shade or  wearing long sleeves, sun glasses (UVA+UVB protection) and wide brim hats (4-inch brim around the entire circumference of the hat). Call for new or changing lesions. Destruction of lesion - right ear helix x 1 Complexity: simple   Destruction method: cryotherapy   Informed consent: discussed and consent obtained   Timeout:  patient name, date of birth, surgical site, and procedure verified Lesion destroyed using liquid nitrogen: Yes   Region frozen until ice ball extended beyond lesion: Yes   Outcome: patient tolerated procedure well with no complications   Post-procedure details: wound care instructions given    INFLAMED SEBORRHEIC KERATOSIS Right Temple x 1 Symptomatic, irritating, patient would like treated. Destruction of lesion - Right Temple x 1 Complexity: simple   Destruction method: cryotherapy   Informed consent: discussed and consent obtained   Timeout:  patient name, date of birth, surgical site, and procedure verified Lesion destroyed using liquid nitrogen: Yes   Region frozen until ice ball extended beyond lesion: Yes   Outcome: patient tolerated procedure well with no complications   Post-procedure details: wound care instructions given    Return in about 1 year (around 05/19/2025) for TBSE.  IEleanor Blush, CMA, am acting as scribe for Alm Rhyme, MD.   Documentation: I have reviewed the above documentation for accuracy and completeness, and I agree with the above.  Alm Rhyme, MD

## 2024-05-23 ENCOUNTER — Encounter: Payer: Self-pay | Admitting: Dermatology

## 2024-06-24 ENCOUNTER — Other Ambulatory Visit: Payer: Self-pay | Admitting: Family Medicine

## 2024-06-24 DIAGNOSIS — E11319 Type 2 diabetes mellitus with unspecified diabetic retinopathy without macular edema: Secondary | ICD-10-CM

## 2024-11-08 ENCOUNTER — Ambulatory Visit: Admitting: Family Medicine

## 2025-05-19 ENCOUNTER — Ambulatory Visit: Admitting: Dermatology

## 2025-05-19 ENCOUNTER — Ambulatory Visit: Payer: Medicare PPO | Admitting: Dermatology
